# Patient Record
Sex: Female | Born: 1965 | Race: White | Hispanic: No | Marital: Married | Smoking: Former smoker
Health system: Southern US, Community
[De-identification: ages and names within clinical notes are randomized; demographics above are authoritative.]

## PROBLEM LIST (undated history)

## (undated) DIAGNOSIS — J449 Chronic obstructive pulmonary disease, unspecified: Secondary | ICD-10-CM

## (undated) DIAGNOSIS — M1990 Unspecified osteoarthritis, unspecified site: Secondary | ICD-10-CM

## (undated) DIAGNOSIS — I509 Heart failure, unspecified: Secondary | ICD-10-CM

## (undated) DIAGNOSIS — D649 Anemia, unspecified: Secondary | ICD-10-CM

## (undated) HISTORY — PX: CHOLECYSTECTOMY: SHX55

## (undated) HISTORY — PX: HIP SURGERY: SHX245

## (undated) HISTORY — PX: ECTOPIC PREGNANCY SURGERY: SHX613

## (undated) HISTORY — PX: HERNIA REPAIR: SHX51

## (undated) HISTORY — PX: KNEE SURGERY: SHX244

## (undated) HISTORY — PX: ABDOMINAL HYSTERECTOMY: SHX81

---

## 2004-07-18 ENCOUNTER — Ambulatory Visit (HOSPITAL_COMMUNITY): Admission: RE | Admit: 2004-07-18 | Discharge: 2004-07-18 | Payer: Self-pay | Admitting: Obstetrics and Gynecology

## 2004-07-21 ENCOUNTER — Inpatient Hospital Stay (HOSPITAL_COMMUNITY): Admission: RE | Admit: 2004-07-21 | Discharge: 2004-07-23 | Payer: Self-pay | Admitting: Obstetrics and Gynecology

## 2004-08-29 ENCOUNTER — Ambulatory Visit (HOSPITAL_COMMUNITY): Admission: RE | Admit: 2004-08-29 | Discharge: 2004-08-29 | Payer: Self-pay | Admitting: Obstetrics and Gynecology

## 2004-09-17 ENCOUNTER — Emergency Department (HOSPITAL_COMMUNITY): Admission: EM | Admit: 2004-09-17 | Discharge: 2004-09-18 | Payer: Self-pay | Admitting: Emergency Medicine

## 2004-10-24 ENCOUNTER — Ambulatory Visit: Payer: Self-pay | Admitting: Internal Medicine

## 2005-10-17 ENCOUNTER — Encounter (HOSPITAL_COMMUNITY): Admission: RE | Admit: 2005-10-17 | Discharge: 2005-11-16 | Payer: Self-pay | Admitting: General Surgery

## 2005-10-30 ENCOUNTER — Encounter (INDEPENDENT_AMBULATORY_CARE_PROVIDER_SITE_OTHER): Payer: Self-pay | Admitting: *Deleted

## 2005-10-30 ENCOUNTER — Ambulatory Visit (HOSPITAL_COMMUNITY): Admission: RE | Admit: 2005-10-30 | Discharge: 2005-10-30 | Payer: Self-pay | Admitting: General Surgery

## 2006-11-30 ENCOUNTER — Encounter: Admission: RE | Admit: 2006-11-30 | Discharge: 2006-11-30 | Payer: Self-pay | Admitting: Orthopedic Surgery

## 2007-04-26 ENCOUNTER — Encounter: Payer: Self-pay | Admitting: Internal Medicine

## 2007-06-27 ENCOUNTER — Ambulatory Visit: Payer: Self-pay | Admitting: Internal Medicine

## 2007-07-15 ENCOUNTER — Ambulatory Visit (HOSPITAL_COMMUNITY): Admission: RE | Admit: 2007-07-15 | Discharge: 2007-07-15 | Payer: Self-pay | Admitting: Internal Medicine

## 2007-07-15 ENCOUNTER — Emergency Department (HOSPITAL_COMMUNITY): Admission: EM | Admit: 2007-07-15 | Discharge: 2007-07-16 | Payer: Self-pay | Admitting: Emergency Medicine

## 2007-07-16 ENCOUNTER — Ambulatory Visit: Payer: Self-pay | Admitting: Internal Medicine

## 2007-07-16 ENCOUNTER — Encounter: Payer: Self-pay | Admitting: Internal Medicine

## 2007-07-16 HISTORY — PX: COLONOSCOPY: SHX174

## 2007-07-16 HISTORY — PX: ESOPHAGOGASTRODUODENOSCOPY: SHX1529

## 2007-07-17 ENCOUNTER — Inpatient Hospital Stay (HOSPITAL_COMMUNITY): Admission: RE | Admit: 2007-07-17 | Discharge: 2007-07-18 | Payer: Self-pay | Admitting: Internal Medicine

## 2007-08-02 ENCOUNTER — Ambulatory Visit: Payer: Self-pay | Admitting: Internal Medicine

## 2007-08-08 ENCOUNTER — Ambulatory Visit (HOSPITAL_COMMUNITY): Admission: RE | Admit: 2007-08-08 | Discharge: 2007-08-08 | Payer: Self-pay | Admitting: Internal Medicine

## 2007-08-16 ENCOUNTER — Ambulatory Visit: Payer: Self-pay | Admitting: Internal Medicine

## 2007-08-16 DIAGNOSIS — J4489 Other specified chronic obstructive pulmonary disease: Secondary | ICD-10-CM | POA: Insufficient documentation

## 2007-08-16 DIAGNOSIS — J449 Chronic obstructive pulmonary disease, unspecified: Secondary | ICD-10-CM

## 2007-09-13 ENCOUNTER — Telehealth (INDEPENDENT_AMBULATORY_CARE_PROVIDER_SITE_OTHER): Payer: Self-pay | Admitting: *Deleted

## 2007-10-01 ENCOUNTER — Ambulatory Visit: Payer: Self-pay | Admitting: Internal Medicine

## 2007-11-12 ENCOUNTER — Ambulatory Visit (HOSPITAL_COMMUNITY): Admission: RE | Admit: 2007-11-12 | Discharge: 2007-11-12 | Payer: Self-pay | Admitting: *Deleted

## 2007-11-29 ENCOUNTER — Telehealth (INDEPENDENT_AMBULATORY_CARE_PROVIDER_SITE_OTHER): Payer: Self-pay | Admitting: *Deleted

## 2008-11-13 ENCOUNTER — Encounter: Admission: RE | Admit: 2008-11-13 | Discharge: 2008-11-13 | Payer: Self-pay | Admitting: Neurosurgery

## 2008-12-14 ENCOUNTER — Emergency Department (HOSPITAL_COMMUNITY): Admission: EM | Admit: 2008-12-14 | Discharge: 2008-12-14 | Payer: Self-pay | Admitting: Emergency Medicine

## 2009-01-06 DIAGNOSIS — R197 Diarrhea, unspecified: Secondary | ICD-10-CM | POA: Insufficient documentation

## 2009-01-06 DIAGNOSIS — R109 Unspecified abdominal pain: Secondary | ICD-10-CM | POA: Insufficient documentation

## 2009-01-06 DIAGNOSIS — R630 Anorexia: Secondary | ICD-10-CM | POA: Insufficient documentation

## 2009-01-06 DIAGNOSIS — R110 Nausea: Secondary | ICD-10-CM | POA: Insufficient documentation

## 2009-01-06 DIAGNOSIS — Z862 Personal history of diseases of the blood and blood-forming organs and certain disorders involving the immune mechanism: Secondary | ICD-10-CM

## 2009-01-06 DIAGNOSIS — F341 Dysthymic disorder: Secondary | ICD-10-CM

## 2009-01-06 DIAGNOSIS — K219 Gastro-esophageal reflux disease without esophagitis: Secondary | ICD-10-CM | POA: Insufficient documentation

## 2009-01-06 DIAGNOSIS — Z9189 Other specified personal risk factors, not elsewhere classified: Secondary | ICD-10-CM | POA: Insufficient documentation

## 2010-04-17 ENCOUNTER — Encounter: Payer: Self-pay | Admitting: Internal Medicine

## 2010-07-01 LAB — BASIC METABOLIC PANEL
Calcium: 10.1 mg/dL (ref 8.4–10.5)
Chloride: 99 mEq/L (ref 96–112)
Potassium: 3.8 mEq/L (ref 3.5–5.1)
Sodium: 138 mEq/L (ref 135–145)

## 2010-08-09 NOTE — H&P (Signed)
[name], [name] NO.:  Burns   MEDICAL RECORD NO.:  [id number]          PATIENT TYPE:  OBV   LOCATION:  A303                          FACILITY:  [facility]   PHYSICIAN:  Dalia Heading, M.D.  DATE OF BIRTH:  20-Sep-1965   DATE OF ADMISSION:  07/16/2007  DATE OF DISCHARGE:  LH                              HISTORY & PHYSICAL   CHIEF COMPLAINT:  Right spigelian hernia.   HISTORY OF PRESENT ILLNESS:  The patient is a 45 year old white female  who was undergoing a colonoscopy today by Dr. Jena Burns for evaluation of  abdominal pain when she developed significant abdominal pain after the  procedure.  A CT scan of the abdomen and pelvis was performed, which  revealed a right spigelian hernia with omental contents present.  A  surgery consultation was obtained and the patient was brought into the  hospital due to her significant abdominal pain and nausea.   PAST MEDICAL HISTORY:  Depression.   PAST SURGICAL HISTORY:  Laparoscopic cholecystectomy in 2007, left hip  and leg surgery, and ectopic pregnancy surgery.   CURRENT MEDICATIONS:  1. Endocet.  2. HyoMax.  3. Diazepam.  4. Alprazolam.  5. Advair.  6. Asmanex inhaler.   ALLERGIES:  CODEINE.   REVIEW OF SYSTEMS:  The patient smokes a pack of cigarettes a day.  She  denies any alcohol use.  She denies any other cardiopulmonary  difficulties or bleeding disorders.   PHYSICAL EXAMINATION:  The patient is a well-developed and well-  nourished white female who is in moderate discomfort due to abdominal  pain.  VITAL SIGNS:  Stable and she is afebrile.  LUNGS:  Clear to auscultation with equal breath sounds bilaterally.  HEART:  Examination reveals a regular rate and rhythm without S3, S4, or  murmurs.  ABDOMEN:  Soft with an obvious spigelian hernia present in this region.  No hepatosplenomegaly or masses are noted.   CT scan of the abdomen and pelvis confirmed a right spigelian hernia  containing adipose  tissue.   IMPRESSION:  Right spigelian hernia.   PLAN:  The patient is scheduled for repair of the right spigelian hernia  on July 17, 2007.  The risks and benefits of the procedure including  bleeding, infection, the possibility of ongoing pain issues not related  to the surgery, and the possibly recurrence of the hernia were fully  explained to the patient, gave informed consent.      Dalia Heading, M.D.  Electronically Signed     MAJ/MEDQ  D:  07/16/2007  T:  07/17/2007  Job:  [id number]   cc:   Samuel Jester  Fax: [phone]   [facility]  Fax: [phone]   R. Roetta Sessions, M.D.  [address]  [place]  [place] [address]

## 2010-08-09 NOTE — Op Note (Signed)
[name], RASPBERRY NO.:  [id number]   MEDICAL RECORD NO.:  [id number]          PATIENT TYPE:  EMS   LOCATION:  ED                            FACILITY:  [facility]   PHYSICIAN:  Dalia Heading, M.D.  DATE OF BIRTH:  07/23/1965   DATE OF PROCEDURE:  07/17/2007  DATE OF DISCHARGE:  07/16/2007                               OPERATIVE REPORT   PREOPERATIVE DIAGNOSIS:  Right spigelian hernia.   POSTOPERATIVE DIAGNOSIS:  Right spigelian hernia.   PROCEDURE:  Right spigelian herniorrhaphy with mesh.   SURGEON:  Dalia Heading, MD.   ANESTHESIA:  General endotracheal.   INDICATIONS:  The patient is a 45 year old white female, who presents  with a symptomatic right spigelian healing hernia.  The risks and  benefits of the procedure including bleeding, infection, and recurrence  of the hernia were fully explained to the patient, gave informed  consent.   PROCEDURE NOTE:  The patient was placed in the supine position.  After  induction of general endotracheal anesthesia, the abdomen was prepped  and draped in the usual sterile technique with Betadine.  Surgical site  confirmation was performed.   An oblique incision was made in the right lower quadrant.  The external  and internal oblique aponeuroses were incised.  The spigelian hernia was  found.  The peritoneal cavity was entered into briefly just to inspect  the peritoneal abdominal wall layer.  No other hernias were noted.  The  peritoneum was closed using 2-0 chromic gut running suture.  Two  polypropylene mesh plugs, a large and a medium size, were used to invert  the hernia.  An onlay polypropylene mesh patch was then placed over this  region.  All was secured using 2-0 Prolene interrupted sutures just to  prevent dislodgement.  The internal and external oblique aponeuroses  were reapproximated using 2-0 Vicryl running sutures.  The external  oblique aponeurosis was again reinforced using 2-0 Vicryl  interrupted  sutures.  The subcutaneous layer was reapproximated using 2-0 Vicryl  interrupted sutures.  The skin was closed using staples.  Then 0.5%  Sensorcaine was instilled into the surrounding wound.  Betadine ointment  and dry sterile dressing were applied.   All tape and needle counts were correct at the end of the procedure.  The patient was extubated in the operating room, went back to recovery  room, awake in stable condition.   COMPLICATIONS:  None.   SPECIMEN:  None.   BLOOD LOSS:  Minimal.      Dalia Heading, M.D.  Electronically Signed     MAJ/MEDQ  D:  07/17/2007  T:  07/17/2007  Job:  [id number]   cc:   R. Roetta Sessions, M.D.  [address]  [place]   [address]   Catrinia Racicot  Fax: [phone]

## 2010-08-09 NOTE — Discharge Summary (Signed)
NAME:  [name], [name] NO.:  [id number]   MEDICAL RECORD NO.:  [id number]          PATIENT TYPE:  INP   LOCATION:  A303                          FACILITY:  [facility]   PHYSICIAN:  Dalia Heading, M.D.  DATE OF BIRTH:  [birth year]   DATE OF ADMISSION:  07/16/2007  DATE OF DISCHARGE:  04/23/2009LH                               DISCHARGE SUMMARY   HOSPITAL COURSE SUMMARY:  The patient is a 45 year old white female who  underwent a colonoscopy by Dr. Roetta Sessions, and had significant  abdominal pain after the procedure.  A CT scan of the abdomen and pelvis  was performed, which revealed a right spigelian hernia.  A surgery  consultation was obtained, and the patient was brought into the hospital  for management of her pain.  She subsequently underwent a right  spigelian herniorrhaphy with mesh on July 17, 2007.  She tolerated the  procedure well.  Postoperative course was remarkable for moderate pain.  Her diet was advanced without difficulty.   The patient is being discharged home on postoperative day 1 in good  improving condition.   DISCHARGE INSTRUCTIONS:  The patient is to follow up with Dr. Franky Macho on July 25, 2007.   DISCHARGE MEDICATIONS:  1. Lortab 10/650 one tablet p.o. q.4 h. p.r.n. pain.  2. HyoMax-SL 0.125 mg p.o. b.i.d.  3. Diazepam 10 mg p.o. q.6 h. p.r.n.  4. Alprazolam 0.5 mg p.o. t.i.d.  5. Advair 1 puff b.i.d.  6. Asmanex inhaler 1 puff daily.   PRINCIPAL DIAGNOSES:  1. Right spigelian hernia.  2. Anxiety/depression.  3. Chronic obstructive pulmonary disease.   PRINCIPAL PROCEDURE:  Right spigelian herniorrhaphy on July 17, 2007.      Dalia Heading, M.D.  Electronically Signed     MAJ/MEDQ  D:  07/18/2007  T:  07/18/2007  Job:  [id number]   cc:   Samuel Jester  Fax: [phone]   R. Roetta Sessions, M.D.  [address]  [place]   [address]

## 2010-08-09 NOTE — Op Note (Signed)
[name], [name]            ACCOUNT NO.:  [id number]   MEDICAL RECORD NO.:  [id number]          PATIENT TYPE:  OBV   LOCATION:  A303                          FACILITY:  [facility]   PHYSICIAN:  R. Roetta Sessions, M.D. DATE OF BIRTH:  [birth year]   DATE OF PROCEDURE:  07/16/2007  DATE OF DISCHARGE:                               OPERATIVE REPORT   INDICATIONS FOR PROCEDURE:  A 45 year old lady with lower abdominal  pain, worsening recently, diarrhea, and history of longstanding  gastroesophageal reflux disease with no prior upper GI tract imaging.  EGD and colonoscopy are now being done.  Prior CT demonstrated abnormal-  appearing liver for which an MRI of the liver was done yesterday.   She has been taking large amounts of Percocet recently for her symptoms.  EGD and colonoscopy are now being done.  This approach has been  discussed with the patient at length.  Potential risks, benefits, and  alternatives have been reviewed.  Questions answered.  Please see  documentation in medical record.   PROCEDURE NOTE:  O2 saturation, blood pressure, pulse, and respirations  monitored throughout the entire procedure.   CONSCIOUS SEDATION:  Versed 9 mg IV, Demerol 200 mg IV in divided doses,  Phenergan 25 mg IV diluted slow IV push, Cetacaine spray, topical  pharyngeal anesthesia.   INSTRUMENT:  Pentax video chip system.   FINDINGS:  EGD:  Examination of tubular esophagus revealed normal-  appearing esophageal mucosa.  EG junction was easily traversed.   Stomach:  Gastric cavity was emptied and insufflated well with air.  Thorough examination of the gastric mucosa including retroflexed view of  the proximal stomach, esophagogastric junction demonstrated only a small  hiatal hernia.  The patient heaved during the procedure and there was  some submucosal hemorrhage; otherwise, gastric mucosa appeared normal.  Pylorus was patent, easily traversed.  Examination of the bulb and  second portion  revealed no abnormality.   THERAPEUTIC/DIAGNOSTIC MANEUVERS PERFORMED:  Biopsies of D2 and D3 were  taken for histologic study.  The patient tolerated the procedure well  and was prepared for colonoscopy.  Digital rectal examination revealed  no abnormalities.   ENDOSCOPIC FINDINGS:  The prep was only fair on the right side.   Colon:  Colonic mucosa was surveyed from the rectosigmoid junction  through the left transverse right colon, and through the appendiceal  orifice, ileocecal valve, and cecum.  These structures were well seen  and photographed for the record.  Terminal ileum was intubated to good  25 cm.  From this level, the scope was slowly withdrawn.  All previously  mentioned mucosal surfaces were again seen.  There was thick, tenacious  coating of stool in the right colon in which exam was more difficult  despite of washing.  The colonic mucosa appeared normal and no obvious  evidence of neoplasm or inflammatory bowel disease.  Stool samples were  taken for microbiology lab.  Remainder of the colonic mucosa again  appeared normal as did terminal ileal mucosa.  The scope was pulled down  the rectum where the rectal mucosa was examined and the rectal wall  was  unable to retroflex, but for same reason, I was able __________ rectal  mucosa __________ and it appeared normal.  The patient had a very high  tolerance for conscious sedation medications, and the colonoscopy was  relatively poorly tolerated although technically it went very well.   IMPRESSION:  Esophagogastroduodenoscopy:  Normal esophagus, small hiatal  hernia, and submucosal petechial hemorrhages secondary to heaving.  Otherwise, normal gastric mucosa, patent pylorus, normal __________ .   COLONOSCOPY FINDINGS:  Normal rectum, colon, and terminal ileum.  Poor  prep on the right side made exam more difficult, status post stool  sampling.   RECOMMENDATIONS:  Her GI symptoms appeared to be somewhat out of  proportion  to objective findings thus far.  She has a MRI of her liver,  review pending.  Depending on her findings of the MRI, she may be best  served by getting a CONTRAST CT of the abdomen and pelvis as the next  step.  I do suspect a significant functional component, and I also  suspect substance dependence in this setting.  Further recommendations  to follow.      Jonathon Bellows, M.D.  Electronically Signed     RMR/MEDQ  D:  07/16/2007  T:  07/17/2007  Job:  [id number]   cc:   Samuel Jester  Fax: [phone]
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
## 2010-08-12 NOTE — Consult Note (Signed)
NAMEKAYELYNN, ABDOU NO.:  [id number]   MEDICAL RECORD NO.:  [id number]          PATIENT TYPE:  EMS   LOCATION:  ED                            FACILITY:  [facility]   PHYSICIAN:  Tilda Burrow, M.D. DATE OF BIRTH:  1965-11-03   DATE OF CONSULTATION:  DATE OF DISCHARGE:  09/18/2004                                   CONSULTATION   CHIEF COMPLAINT:  Abdominal pain, fever.  A 45 year old female, well known  to [place] for chronic abdominal pain, recently status post hysterectomy which  went quite well with the interesting of elongate omentum stuck down in the  cul-de-sac with apparent tension on the transverse colon.  Admitted after  presumed emergency room complaining of having fever, abdominal pain, being  sick, sick as a dog.  The patient is well known to our office.  Last  Wednesday we did an injection of the lateral aspects of the Pfannenstiel  incision and the patient had dramatic relief of her chronic right lower  quadrant pain.  We believe the right lower quadrant pain to be  musculoskeletal, related to the incision healing.  Tonight, she has fever,  generalized ache, anorexia, has been throwing up and the vomiting causes her  to hurt in the right lower quadrant, almost as expected.   LABORATORY DATA:  White count of 14,000 with 95 neutrophils, 3 bands.  CT  scan has been performed which shows no focal abnormalities.  Specifically  negative for any insuperforation or infective abscess.  Exam by Dr. Margretta Ditty  is documented elsewhere with abdomen having active bowel sounds without  rebound or guarding.  Periodic reassessments of the patient are interesting  and the patient during the time she is here in the ER has developed diarrhea  and has had three episodes of loose, watery stools in the past two hours.  It is my impression that she has a gastroenteritis that will be self-limited  and has poor tolerance of it, consistent with her overall poor tolerance of  medical  discomforts.   PLAN:  Discharge the patient home to continue liquids, give fluid bolus  prior to discharge.  Have her use Imodium as necessary.   FOLLOWUP:  Follow up in our office Monday.  She is to contact our office if  symptoms persist.       JVF/MEDQ  D:  09/18/2004  T:  09/18/2004  Job:  [id number]

## 2010-08-12 NOTE — H&P (Signed)
NAME:  [name], [name] NO.:  [id number]   MEDICAL RECORD NO.:  [id number]          PATIENT TYPE:  AMB   LOCATION:  DAY                           FACILITY:  [facility]   PHYSICIAN:  Tilda Burrow, M.D. DATE OF BIRTH:  [birth year]   DATE OF ADMISSION:  07/18/2004  DATE OF DISCHARGE:  LH                                HISTORY & PHYSICAL   ADMISSION DIAGNOSES:  Chronic pelvic pain, scheduled for abdominal  hysterectomy and possible removal of both tubes and ovaries.   HISTORY OF PRESENT ILLNESS:  This 45 year old female has been evaluated in  our office for chronic pelvic pain. The patient has had a four to five month  history of progressive lower abdominal pain after a long-standing history of  lesser degree of pelvic discomfort. This history goes as far back as 1990  when she was operated on by Dr. Ralph Dowdy and told at the time of surgery she  would never get pregnant. At that time, she had removal of a kinked tube.  She then subsequently went on to have a normal pregnancy followed by an  ectopic pregnancy in 2003 treated with laparotomy and salpingectomy. Records  have been reviewed, and she had a right salpingectomy at [facility]  after presenting with ectopic pregnancy diagnosed in the emergency room. In  evaluation in our office, she has had a dull, severe, lower abdominal pain  in the stomach. Ultrasound has been performed which shows normal external  genitalia and nonpurulent cervix, 3+ tender to contact on bimanual exam or  ultrasound. The uterus measures 8.1 cm in length x 5.0 x approximately 6 cm  transverse diameters. There is no gross abnormalities of the uterus, but it  is exquisitely tender to contact with even the lightest of uterine probe  contact. On initial visit, she probably quoted pain as a 10/10. This pain  has been present pretty continuously since February. GC and chlamydia  cultures are negative. She has been treated with empiric antibiotic  regimen  without success. Given the history of chronic pain with the acute  exacerbation, the negative ultrasound, the absence of acute evidence of  infection, and the chronic incapacitating nature, we have decided to proceed  to a hysterectomy with probable removal of tubes and ovaries as well unless  obviously normal ovaries are encountered. Given the history, this is  considered likely to require removal of tubes and ovaries.   REVIEW OF SYSTEMS:  Positive for poor circulation of lower extremities of a  long-standing nature. The patient has nonspecific traumatic complaints that  do not match the original pain complaints such as toes tingling while  standing and normal activity. She denies any peripheral neuropathy to her  legs or back pain.   HABITS:  Cigarettes, one pack per day. Alcohol denied. THC denied.   ALLERGIES:  CODEINE causes rash.   PHYSICAL EXAMINATION:  GENERAL:  Shows a sober female in apparent  significant discomfort.  VITAL SIGNS:  Blood pressure 130/80, pulse 70. Urinalysis negative.  HEENT:  Pupils are equal, round, and reactive.  NECK:  Supple.  CHEST:  Clear to  auscultation.  CARDIOVASCULAR:  Unremarkable.  ABDOMEN:  Moderate bloating with normal bowel sounds present. Well healed  lower abdominal mini laparotomy incision.  EXTERNAL GENITALIA:  Normal.  CERVIX:  Nonpurulent.  UTERUS:  Distinctly tender to contact at last check.  ADNEXA:  Shows small ovaries on each lateral side wall, less tender than the  uterine contact. There is no cul-de-sac fluid. Recent abdominal ultrasound  was ordered at [facility] on July 19, 2004.   ASSESSMENT:  Chronic pelvic pain, history of pelvic disease and infection.   PLAN:  Hysterectomy, probable removal of tubes and ovaries July 21, 2004.   The patient is made aware of the risks of infection and that absolute  guarantee of improved condition cannot be offered. The patient acknowledges  those limitations but says At  least with surgery, I have hope.      JVF/MEDQ  D:  07/19/2004  T:  07/19/2004  Job:  [id number]

## 2010-08-12 NOTE — H&P (Signed)
NAME:  [name], [name] NO.:  [id number]   MEDICAL RECORD NO.:  [id number]          PATIENT TYPE:  AMB   LOCATION:                                FACILITY:  [facility]   PHYSICIAN:  Dalia Heading, M.D.  DATE OF BIRTH:  23-Feb-1966   DATE OF ADMISSION:  DATE OF DISCHARGE:  LH                                HISTORY & PHYSICAL   CHIEF COMPLAINT:  Chronic cholecystitis.   HISTORY OF PRESENT ILLNESS:  The patient is a 45 year old white female who  is referred for evaluation and treatment of abdominal pain.  She complains  of both suprapubic and upper abdominal pain.  She does have some point  tenderness noted in the suprapubic region, and she is status post a  hysterectomy last year by Dr. Emelda Fear.  She has been having right upper  quadrant abdominal pain, nausea, and bloating for many weeks.  She does have  fatty food intolerance.  No fever, chills, or jaundice have been noted.   PAST MEDICAL HISTORY:  Includes depression.   PAST SURGICAL HISTORY:  As noted above, left leg and hip surgery, ectopic  pregnancy surgery.   CURRENT MEDICATIONS:  Cymbalta 1 tablet p.o. every day.   ALLERGIES:  CODEINE.   REVIEW OF SYSTEMS:  The patient smokes a pack of cigarettes a day.  She  denies any alcohol use.   PHYSICAL EXAMINATION:  GENERAL:  The patient is a well-developed and well-  nourished white female in no acute distress.  HEENT:  Reveals no scleral icterus.  LUNGS:  Clear to auscultation with equal breath sounds bilaterally.  HEART:  Reveals a regular rate and rhythm without S3, S4, or murmurs.  ABDOMEN:  Soft and nondistended.  She is tender both in the suprapubic  region and in the right upper quadrant.  Thickened subcutaneous tissue is  noted in the The suprapubic region.  No spigelian hernias appreciated.   CT scan of the abdomen revealed a possible small right spigelian hernia  containing only fat.  Hepatobiliary scan was performed which revealed  chronic  cholecystitis with an ejection fraction of 27%.   IMPRESSION:  1.  Biliary colic secondary to chronic cholecystitis.  2.  Asymptomatic right spigelian hernia.  3.  Incisional pain from a previous surgery.   PLAN:  At this point, she is to undergo a laparoscopic cholecystectomy.  Risks and benefits of the procedure including bleeding, infection,  hepatobiliary injury, the possibility of an open procedure were fully  explained to the patient, who gave informed consent.  At the time of  surgery, the abdominal wall will be visualized both in the right lower  quadrant region as well as suprapubic region.      Dalia Heading, M.D.  Electronically Signed     MAJ/MEDQ  D:  10/19/2005  T:  10/19/2005  Job:  [id number]   cc:   Dalia Heading, M.D.  Fax: [phone]   [facility]  Fax: [phone]   [facility]on Nickolas  Fax: [phone]

## 2010-08-12 NOTE — Discharge Summary (Signed)
[name], [name] NO.:  [id number]   MEDICAL RECORD NO.:  [id number]          PATIENT TYPE:  INP   LOCATION:  A416                          FACILITY:  [facility]   PHYSICIAN:  Tilda Burrow, M.D. DATE OF BIRTH:  [birth year]   DATE OF ADMISSION:  07/21/2004  DATE OF DISCHARGE:  04/29/2006LH                                 DISCHARGE SUMMARY   ADMITTING DIAGNOSIS:  Chronic pelvic pain.   DISCHARGE DIAGNOSES:  1.  Chronic pelvic pain.  2.  Pelvic adhesions.  3.  Pelvic relaxation.   PROCEDURE:  Total abdominal hysterectomy, lysis of adhesions, appendectomy.   SURGEON:  Emelda Fear   ASSISTANT:  _tulloch. cst,fa_______   ANESTHESIA:  General.   SPECIMENS:  Uterus, omental adhesions, appendix.   ESTIMATED BLOOD LOSS:  Less than 200 mL.   FINDINGS:  Omentum stuck in the deep cul-de-sac, appendix stuck down in the  cul-de-sac as well.   DISCHARGE MEDICATIONS:  1.  Lexapro 10 mg p.o. once daily.  2.  Lortab 10/500 one to two q.6h. p.r.n. pain dispense 40 refill x1.   FOLLOW UP:  Postop day #8 our office for staple removal.   HOSPITAL SUMMARY:  [age]-year-old female was admitted for severe pain  of the uterus which had been going on for several years.  The patient had  trouble even standing up straight.  She had a history of right salpingectomy  after ectopic pregnancy, some of the dull lower abdominal pain interfered  with normal activities and the uterus was exquisitely tender on contact  though GC and Chlamydia cultures were negative.   HOSPITAL COURSE:  Patient was taken to the operating room through day  surgery after a bowel prep.  The  uterus was removed and extensive omental  adhesions to the cul-de-sac dissected free.  The appendix was caught up in  the cul-de-sac adhesions and was removed as well.  It is felt this is  completely consistent with the patient's history of a traction type of pain  that was worse when she tried to stand up  straight.   The patient had an admitting hemoglobin of 12.7, hematocrit 36.8,  postoperatively it was 11.3, hematocrit 32.1.  She had resumption of bowel  function within 2 days and was discharged home postop day #2 on Lortab 10  and Lexapro 10, has postop care followup 8 days our office.      JVF/MEDQ  D:  07/23/2004  T:  07/23/2004  Job:  [id number]   cc:   [address]  [place]  [place] [address]  Fax: [phone]

## 2010-08-12 NOTE — Op Note (Signed)
NAMEGLEN, BLATCHLEY NO.:  [id number]   MEDICAL RECORD NO.:  [id number]          PATIENT TYPE:  AMB   LOCATION:  DAY                           FACILITY:  [facility]   PHYSICIAN:  Dalia Heading, M.D.  DATE OF BIRTH:  February 21, 1966   DATE OF PROCEDURE:  10/30/2005  DATE OF DISCHARGE:                                 OPERATIVE REPORT   PREOPERATIVE DIAGNOSIS:  Chronic cholecystitis.   POSTOPERATIVE DIAGNOSIS:  Chronic cholecystitis.   PROCEDURE:  Laparoscopic cholecystectomy.   SURGEON:  Dalia Heading, M.D.   ASSISTANT:  Bernerd Limbo. Leona Carry, M.D.   ANESTHESIA:  General endotracheal.   INDICATIONS:  The patient is a 45 year old white female who presents with  chronic cholecystitis and biliary colic.  The risks and benefits of the  procedure including bleeding, infection, hepatobiliary injury, and the  possibly an open procedure, were fully explained to the patient, who gave  informed consent.   PROCEDURE NOTE:  The patient was placed in the supine position.  After  induction of general endotracheal anesthesia, the abdomen was prepped and  draped using the usual sterile technique with Betadine.  Surgical site  confirmation was performed.   A supraumbilical incision was made down to the fascia.  A Veress needle was  introduced into the abdominal cavity and confirmation of placement was done  using the saline drop.  The abdomen was then insufflated to 16 mmHg  pressure.  An 11-mm trocar was introduced into the abdominal cavity under  direct visualization without difficulty.  The patient was placed in reversed  Trendelenburg position.  An additional 11-mm trocar was placed in the  epigastric region and 5-mm trocars placed in the right upper quadrant and  right flank regions.  The liver was inspected and noted to be within normal  limits.  The abdominal wall was inspected and no specific hernias could be  identified.  The gallbladder was retracted superiorly and  laterally.  The  dissection was begun around the infundibulum of the gallbladder.  The cystic  duct was first identified.  Its juncture to the infundibulum fully  identified.  Endoclips were placed proximally and distally on the cystic  duct and cystic duct was divided.  This was likewise done to the cystic  artery.  The gallbladder then freed away from the gallbladder fossa using  Bovie electrocautery.  The gallbladder was delivered through the epigastric  trocar site using an EndoCatch bag.  The gallbladder fossa was inspected and  no abnormal bleeding or bile leakage was noted.  Surgicel was placed in the  gallbladder fossa.  All fluid and air were then evacuated from the abdominal  cavity prior to removal of the trocars.   All wounds were irrigated with normal saline.  All wounds were injected with  0.5% Sensorcaine.  The supraumbilical fascia was reapproximated using an 0  Vicryl interrupted suture.  All skin incisions were closed using staples.  Betadine ointment and a dry sterile dressing were applied.   All tape and needle counts were correct at the end of the procedure.  The  patient was extubated in  the operating room went back to the recovery room  awake in stable condition.   COMPLICATIONS:  None.   SPECIMEN:  Gallbladder.   BLOOD LOSS:  Minimal.      Dalia Heading, M.D.  Electronically Signed     MAJ/MEDQ  D:  10/30/2005  T:  10/30/2005  Job:  [id number]   cc:   Samuel Jester  Fax: [phone]

## 2010-08-12 NOTE — Op Note (Signed)
[name], [name] NO.:  [id number]   MEDICAL RECORD NO.:  [id number]          PATIENT TYPE:  INP   LOCATION:  A416                          FACILITY:  [facility]   PHYSICIAN:  Tilda Burrow, M.D. DATE OF BIRTH:  [birth year]   DATE OF PROCEDURE:  07/21/2004  DATE OF DISCHARGE:  07/23/2004                                 OPERATIVE REPORT   PREOPERATIVE DIAGNOSIS:  Chronic pelvic pain.   POSTOPERATIVE DIAGNOSES:  1.  Chronic pelvic pain.  2.  Pelvic adhesions.  3.  Pelvic relaxation.   OPERATION/PROCEDURE:  1.  Total abdominal hysterectomy.  2.  Lysis of adhesions.  3.  Appendectomy.   SURGEON:  Tilda Burrow, M.D.   ASSISTANT:  _Tulloch__ C.S.T.-F.A.   ANESTHESIA:  General.   SPECIMENS:  Uterus, omental adhesions, appendix.   ESTIMATED BLOOD LOSS:  Less than 200 mL.   FINDINGS:  Omentum stuck deep in a very deep cul-de-sac.  Normal-appearing  ovaries.   INDICATIONS:  A 45 year old female with chronic pelvic pain, particularly  noted when the patient tries to stand up from a sitting for flexed forward  position, going on at least a couple of years.  See HPI.   DESCRIPTION OF PROCEDURE:  The patient was taken to the operating room,  Foley catheter in place, vaginal and abdominal prep performed.  A  Pfannenstiel-type incision was performed with excision of the old laparotomy  incision to improve symmetry of the surgical scar.  Pfannenstiel-type  peritoneal entry was performed without difficulty with no evidence of  adhesions to the anterior abdominal wall.  The omentum was interestingly  stuck with significant tightness to the cul-de-sac.  There were a couple of  thin adhesions to the back side of the uterus.  We carefully elevated the  uterus with Lahey thyroid tenaculum and proceeded to sharply dissect the  omental adhesions off the cul-de-sac.  The omentum actually had multiple  windows in it and the transverse colon was well below the  umbilicus.  It is  my belief that she had had longstanding traction on the transverse colon and  omentum from these extensive adhesions.  Tubes and ovaries were inspected  and were completely normal in appearance.  Decision was made to try to  salvage them.  Once the omentum could be dissected free, the windows were  dealt with transecting of the inferior-most portion of the omentum with 3-0  Dexon ties used to cross the omental pedicles.  We then proceeded with  hysterectomy.  Round ligaments were doubly ligated bilaterally, transected  and bladder flap developed.  The utero-ovarian ligaments were isolated,  clamped, cut and suture ligated on either side with 0 chromic, and then the  uterine vessels skeletonized on either side.  We marched down the lateral  side of the uterus, clamping across the uterine broad ligament and uterine  vessels with curved Heaney clamps.  Kelly clamp used for backbleeding,  transecting and suture ligating with 0 chromic.  The upper and lower  cardinal ligaments were clamped, cut and suture ligated serially using  straight Heaney clamps, knife dissection and  0 chromic suture ligature.  The cervix was quite large and bulbous.  The uterosacral ligament came into  the back of the uterus and there was a very deep cul-de-sac.  We then  proceeded to make a stab incision in the anterior cervical fornix, amputate  the cervix off the vaginal cuff and hold the vaginal cuff with Kochers x4.  The lateral vaginal angle was attached to the lower cardinal ligaments with  an Aldridge stitch on either side.  We felt that the vaginal apex was quite  large and potential for vaginal vault prolapse would be great and,  therefore, modified cuff closure was performed.  We closed the cuff in the  sagittal plane.  The first thing we did was excise a wedge of tissue from  the anterior portion of the vaginal cuff and then closed the cuff with a  series of interrupted sutures beginning in  the posterior cul-de-sac.  This  pulled the uterosacral ligament into the cuff support system.  This improved  mid pelvic support without achieving tautness and likely reduces potential  for enterocele or vaginal vault prolapse.   The peritoneum was not pulled over the cuff, the bladder flap was left only  tagged over the middle portion of the cuff.  Hemostasis was satisfactory.  Irrigation of the pelvis was performed without difficulty.   It should be noted that the appendix was unusually long and stuck down in  the omental adhesions as well at its tip.  This raised the question of  whether this had all been chronic appendicitis that had started the problem  or PID.  Is unable to determine the etiology of her original problems.   We then proceeded with appendectomy.  The appendix was long and retrocecal.  The appendiceal mesentery was divided into multiple pedicles with the  hemostats placed across the pedicles and the appendix isolated.  The  pedicles were tied with 3-0 Dexon ties.  The appendiceal stump itself was  doubly clamped with hemostats transected between them.  A ligation with 2-0  chromic performed on the appendiceal stump.  A pursestring suture was then  performed to imbricate the appendiceal stump to reduce potential for  pressure on the appendiceal stump later.  The patient tolerated the  procedure well.  Irrigation was again performed. There was no evidence of  injury to bowel noted.  The omentum was repositioned under the anterior  incision, not allowed to reach into the cul-de-sac where it had previously  been.  We closed the peritoneum with 2-0 chromic, closed the fascia with  continuous running 0 Dexon, closed the subcutaneous potential space with 2-0  plain and staple closure of the skin the complete the procedure.  The  patient went to the recovery room in good condition.  Sponge and needle  counts correct.  Estimated blood loss 200 mL.    JVF/MEDQ  D:   07/23/2004  T:  07/24/2004  Job:  [id number]   cc:   Leo [id number]  [place]  [place] [address]  Fax: [phone]

## 2010-08-12 NOTE — Op Note (Signed)
[name], [name] NO.:  [id number]   MEDICAL RECORD NO.:  [id number]          PATIENT TYPE:  INP   LOCATION:  A416                          FACILITY:  [facility]   PHYSICIAN:  Tilda Burrow, M.D. DATE OF BIRTH:  26-Aug-1965   DATE OF PROCEDURE:  DATE OF DISCHARGE:  07/23/2004                                 OPERATIVE REPORT   PREOPERATIVE DIAGNOSIS:  Chronic pelvic pain.   POSTOPERATIVE DIAGNOSES:  1.  Chronic pelvic pain.  2.  Extensive pelvic adhesions, omentum to pelvic cul-de-sac.   OPERATION/PROCEDURE:  1.  Total abdominal hysterectomy.  2.  Lysis of pelvic adhesions.  3.  Appendectomy.   SURGEON:  Tilda Burrow, M.D.   ANESTHESIA:  General.   COMPLICATIONS:  None.   FINDINGS:  Very elongate omentum which is stretched down under tension from  the transverse colon which was quite stretched downward in its own right.  Omental adhesions were densely attached to the cul-de-sac and felt to  represent a likely source of pain.   DESCRIPTION OF PROCEDURE:  The patient was taken to the operating room,  prepped and draped for lower abdominal surgery.  Foley catheter was then  inserted and vaginal prepping was performed.  The lower abdominal transverse  incision was corrected from its rather asymmetric angulation by taking an  ellipse of skin and fatty tissue incorporating the old scar, being careful  to be symmetric and in our incisions.  The abdominal cavity was entered in  the standard method of Pfannenstiel with transverse opening of the fascia,  midline splitting of the rectus muscles and careful blunt and sharp  dissection used to identify the peritoneal cavity and open the peritoneum.  Incision was opened superiorly and inferiorly, and space in the pelvis  performed.  There was extensive omental adhesions relatively thin in  character but extensive adhesions to the back of the uterus and to the cul-  de-sac.  These required careful serial  dissection.  Two small wet laparotomy  tapes were used to lift the portions of the bowel that were free and then we  proceeded with transverse primary sharp dissection in order to free up the  pelvis.  Interestingly, the ovaries were absolutely not involved in the  omental adhesions.  We were able to free up the omentum leaving only very  small remnants of omentum tissue in the cul-de-sac.   The Lahey thyroid tenaculum was being used on the uterine fundus to elevate  the uterus.  The uterus was actually quite relaxed in its support.  Round  ligaments were then clamped, cut and suture ligated taking them down, and  then the utero-ovarian ligaments on either side isolated, clamped, cut and  suture ligated.  Uterine vessels could be skeletonized on each side and then  curved Heaney clips were placed across the uterine vessels with Kelly clamps  for backbleeding.  The vessels were transected and ligated with 0 chromic.  The upper and lower cardinal ligaments were serially clamped, cut and suture  ligated on either side, marching down the cervix to the vaginal cuff.  The  cervix  was quite large and wide and a stab incision in the anterior cervical  vaginal fornix was followed by amputating the cervix off the vaginal cuff  and hold the cuff with four Kocher clamps.  The cuff was quite large and  combined with pelvic laxity, it was felt that pelvic support required  improvement.  Decision was made to close the cuff in the midline.   First, an Aldridge stitch was placed in each lateral vaginal angle to attach  lower cardinal ligaments to the cuff and control hemostasis.  We then  proceeded to remove a small wedge from the anterior cervix to allow for  easier approximation of the vaginal apex and reduction of the large vaginal  apex diameter.  A posterior cul-de-sac was pulled together in the midline as  well which brought the uterosacral ligaments into the cuff support in what  was felt to be a  very constructive way.  The entire cuff was closed in the  midline with good tissue support resulting.  The cuff was not too tight but  was considered adequately supported.  Irrigation of the pelvis followed and  then hemostasis confirmed, then with bladder flap reapproximation to the  anterior one-half of the cuff and inspecting the ovary and pedicles for  hemostasis.  The peritoneum, then the laparotomy equipment was removed.  Laparotomy tapes were removed.  The bowel was repositioned in its normal  orientation with no further problems identified.  It should be noted that  the transverse colon naturally rested below the umbilicus due to its  elongate lax positioning and it felt that perhaps this was related to  adhesions in the cul-de-sac.  The inferior irregular margins of the omentum  where there were some windows in the omentum were trimmed free and the ends  ligated as necessary to achieve hemostasis.   At this time the procedure was considered satisfactorily completed.  The  anterior peritoneum was closed using 2-0 Prolene and fascia closed with  continuous running 0 Vicryl.  Subcutaneous tissue was reapproximated with 2-  0 plain and stapled closure of the skin completed the procedure.  The  patient tolerated the procedure well and went to the recovery room in good  condition.      JVF/MEDQ  D:  07/27/2004  T:  07/27/2004  Job:  [id number]

## 2010-09-30 ENCOUNTER — Emergency Department (HOSPITAL_COMMUNITY)
Admission: EM | Admit: 2010-09-30 | Discharge: 2010-09-30 | Discharge status: Against Medical Advice | Payer: 59 | Attending: Emergency Medicine | Admitting: Emergency Medicine

## 2010-11-26 ENCOUNTER — Emergency Department (HOSPITAL_COMMUNITY)
Admission: EM | Admit: 2010-11-26 | Discharge: 2010-11-26 | Disposition: A | Discharge status: Home / Self Care | Payer: 59 | Attending: Emergency Medicine | Admitting: Emergency Medicine

## 2010-11-26 ENCOUNTER — Encounter: Payer: Self-pay | Admitting: *Deleted

## 2010-11-26 DIAGNOSIS — J449 Chronic obstructive pulmonary disease, unspecified: Secondary | ICD-10-CM | POA: Insufficient documentation

## 2010-11-26 DIAGNOSIS — J4489 Other specified chronic obstructive pulmonary disease: Secondary | ICD-10-CM | POA: Insufficient documentation

## 2010-11-26 DIAGNOSIS — R21 Rash and other nonspecific skin eruption: Secondary | ICD-10-CM | POA: Insufficient documentation

## 2010-11-26 DIAGNOSIS — W57XXXA Bitten or stung by nonvenomous insect and other nonvenomous arthropods, initial encounter: Secondary | ICD-10-CM

## 2010-11-26 DIAGNOSIS — F17200 Nicotine dependence, unspecified, uncomplicated: Secondary | ICD-10-CM | POA: Insufficient documentation

## 2010-11-26 HISTORY — DX: Chronic obstructive pulmonary disease, unspecified: J44.9

## 2010-11-26 HISTORY — DX: Anemia, unspecified: D64.9

## 2010-11-26 MED ORDER — FAMOTIDINE 20 MG PO TABS: ORAL_TABLET | ORAL | Status: DC

## 2010-11-26 MED ORDER — FAMOTIDINE 20 MG PO TABS
20.0000 mg | ORAL_TABLET | Freq: Once | ORAL | Status: AC
  Administered 2010-11-26: 20 mg via ORAL
  Filled 2010-11-26: qty 1

## 2010-11-26 MED ORDER — DIPHENHYDRAMINE HCL 25 MG PO TABS: ORAL_TABLET | ORAL | Status: DC

## 2010-11-26 MED ORDER — PREDNISONE 10 MG PO TABS: ORAL_TABLET | ORAL | Status: DC

## 2010-11-26 MED ORDER — METHYLPREDNISOLONE SODIUM SUCC 125 MG IJ SOLR
125.0000 mg | Freq: Once | INTRAMUSCULAR | Status: AC
  Administered 2010-11-26: 125 mg via INTRAMUSCULAR
  Filled 2010-11-26: qty 2

## 2010-11-26 MED ORDER — DIPHENHYDRAMINE HCL 50 MG/ML IJ SOLN
50.0000 mg | Freq: Once | INTRAMUSCULAR | Status: AC
  Administered 2010-11-26: 50 mg via INTRAMUSCULAR
  Filled 2010-11-26: qty 1
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
## 2010-11-26 NOTE — ED Notes (Signed)
Family at bedside. 
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
## 2010-12-05 ENCOUNTER — Encounter (HOSPITAL_COMMUNITY): Payer: Self-pay | Admitting: *Deleted

## 2010-12-05 ENCOUNTER — Emergency Department (HOSPITAL_COMMUNITY)
Admission: EM | Admit: 2010-12-05 | Discharge: 2010-12-06 | Disposition: A | Discharge status: Other Acute Inpatient Hospital | Payer: 59 | Attending: Emergency Medicine | Admitting: Emergency Medicine

## 2010-12-05 DIAGNOSIS — J449 Chronic obstructive pulmonary disease, unspecified: Secondary | ICD-10-CM | POA: Insufficient documentation

## 2010-12-05 DIAGNOSIS — Z862 Personal history of diseases of the blood and blood-forming organs and certain disorders involving the immune mechanism: Secondary | ICD-10-CM | POA: Insufficient documentation

## 2010-12-05 DIAGNOSIS — F22 Delusional disorders: Secondary | ICD-10-CM

## 2010-12-05 DIAGNOSIS — J4489 Other specified chronic obstructive pulmonary disease: Secondary | ICD-10-CM | POA: Insufficient documentation

## 2010-12-05 DIAGNOSIS — R45851 Suicidal ideations: Secondary | ICD-10-CM

## 2010-12-05 HISTORY — DX: Unspecified osteoarthritis, unspecified site: M19.90

## 2010-12-05 LAB — RAPID URINE DRUG SCREEN, HOSP PERFORMED
Amphetamines: POSITIVE — AB
Benzodiazepines: NOT DETECTED
Cocaine: NOT DETECTED
Opiates: NOT DETECTED

## 2010-12-05 LAB — DIFFERENTIAL
Basophils Absolute: 0 10*3/uL (ref 0.0–0.1)
Basophils Relative: 0 % (ref 0–1)
Neutro Abs: 7.6 10*3/uL (ref 1.7–7.7)
Neutrophils Relative %: 73 % (ref 43–77)

## 2010-12-05 LAB — URINALYSIS, ROUTINE W REFLEX MICROSCOPIC
Bilirubin Urine: NEGATIVE
Glucose, UA: NEGATIVE mg/dL
Ketones, ur: NEGATIVE mg/dL
Leukocytes, UA: NEGATIVE
pH: 6 (ref 5.0–8.0)

## 2010-12-05 LAB — CBC
MCH: 32.8 pg (ref 26.0–34.0)
RDW: 14.1 % (ref 11.5–15.5)

## 2010-12-05 LAB — BASIC METABOLIC PANEL
Chloride: 106 mEq/L (ref 96–112)
GFR calc Af Amer: >60 mL/min (ref >60)
Potassium: 3.6 mEq/L (ref 3.5–5.1)
Sodium: 139 mEq/L (ref 135–145)

## 2010-12-05 MED ORDER — LORAZEPAM 1 MG PO TABS
1.0000 mg | ORAL_TABLET | Freq: Three times a day (TID) | ORAL | Status: DC | PRN
  Administered 2010-12-06: 1 mg via ORAL
  Filled 2010-12-05: qty 1

## 2010-12-05 MED ORDER — IBUPROFEN 400 MG PO TABS: 400.0000 mg | ORAL_TABLET | Freq: Three times a day (TID) | ORAL | Status: DC | PRN

## 2010-12-05 MED ORDER — ALUM & MAG HYDROXIDE-SIMETH 200-200-20 MG/5ML PO SUSP: 30.0000 mL | ORAL | Status: DC | PRN

## 2010-12-05 MED ORDER — NICOTINE 21 MG/24HR TD PT24
MEDICATED_PATCH | TRANSDERMAL | Status: AC
  Filled 2010-12-05: qty 1

## 2010-12-05 MED ORDER — ONDANSETRON HCL 4 MG PO TABS: 4.0000 mg | ORAL_TABLET | Freq: Three times a day (TID) | ORAL | Status: DC | PRN

## 2010-12-05 MED ORDER — NICOTINE 21 MG/24HR TD PT24
21.0000 mg | MEDICATED_PATCH | Freq: Every day | TRANSDERMAL | Status: DC | PRN
  Administered 2010-12-05: 20:00:00 via TRANSDERMAL

## 2010-12-05 MED ORDER — ZOLPIDEM TARTRATE 5 MG PO TABS: 5.0000 mg | ORAL_TABLET | Freq: Every evening | ORAL | Status: DC | PRN

## 2010-12-05 MED ORDER — ACETAMINOPHEN 325 MG PO TABS: 650.0000 mg | ORAL_TABLET | ORAL | Status: DC | PRN

## 2010-12-05 NOTE — ED Notes (Signed)
Pt here under IVC papers with hand written note attached to her family stating that she cannot go on without her husband.  Pt admits to writing the note last night.  Pt denies having SI/HI at this time.  States "I just had a weak moment in time."  Pt calm, cooperative at this time.

## 2010-12-05 NOTE — ED Notes (Signed)
rn at bedside for telepsych consult

## 2010-12-05 NOTE — ED Notes (Signed)
Telepsych consulted. Awaiting return call for psychiatrist

## 2010-12-05 NOTE — ED Notes (Signed)
A&Ox3. Skin w/p/d. Resp even and unlabored. Pt complaining of R hand pain. Pt calm and cooperative at this time. Denies SI/HI. Denies auditory or visual hallucinations. Pt mother at bedside. Awaiting placement.

## 2010-12-05 NOTE — ED Provider Notes (Signed)
History     CSN: [id number] Arrival date & time: 12/05/2010  2:05 PM  Chief Complaint  Patient presents with  . Medical Clearance   HPI Pt was seen at 1420.  Per pt, c/o gradual onset and persistence of constant anxiety and depression for the past several days.  States she "found out that my husband may have cheated on me and fathered a child 2 years ago" over this past weekend.  States she has been with him from the time she was 45yo, has multiple children and grandchildren; including a disabled 13yo child.  Pt states she was "upset" and "didn't think I could live without him."  States she wrote a letter saying so, threatening SI, and was brought today to the ED under IVC by family.  States she has spoken with her mother and children and feels "calmer now."  Denies HI, no SA.  Past Medical History  Diagnosis Date  . COPD (chronic obstructive pulmonary disease)   . Asthma   . Anemia   . Arthritis     Past Surgical History  Procedure Date  . Abdominal hysterectomy   . Cholecystectomy   . Hip surgery   . Knee surgery   . Ectopic pregnancy surgery     History  Substance Use Topics  . Smoking status: Current Everyday Smoker -- 1.0 packs/day  . Smokeless tobacco: Not on file  . Alcohol Use: No    Review of Systems ROS: Statement: All systems negative except as marked or noted in the HPI; Constitutional: Negative for fever and chills. ; ; Eyes: Negative for eye pain, redness and discharge. ; ; ENMT: Negative for ear pain, hoarseness, nasal congestion, sinus pressure and sore throat. ; ; Cardiovascular: Negative for chest pain, palpitations, diaphoresis, dyspnea and peripheral edema. ; ; Respiratory: Negative for cough, wheezing and stridor. ; ; Gastrointestinal: Negative for nausea, vomiting, diarrhea and abdominal pain, blood in stool, hematemesis, jaundice and rectal bleeding. . ; ; Genitourinary: Negative for dysuria, flank pain and hematuria. ; ; Musculoskeletal: Negative for back  pain and neck pain. Negative for swelling and trauma.; ; Skin: Negative for pruritus, rash, abrasions, blisters, bruising and skin lesion.; ; Neuro: Negative for headache, lightheadedness and neck stiffness. Negative for weakness, altered level of consciousness , altered mental status, extremity weakness, paresthesias, involuntary movement, seizure and syncope.  +depression, anxiety.     Physical Exam  BP 152/69  Pulse 74  Temp(Src) 98.7 F (37.1 C) (Oral)  Resp 16  Ht 5\' 4"  (1.626 m)  Wt 134 lb (60.782 kg)  BMI 23.00 kg/m2  SpO2 100%  Physical Exam 1425: Physical examination:  Nursing notes reviewed; Vital signs and O2 SAT reviewed;  Constitutional: Well developed, Well nourished, Well hydrated, In no acute distress; Head:  Normocephalic, atraumatic; Eyes: EOMI, PERRL, No scleral icterus; ENMT: Mouth and pharynx normal, Mucous membranes moist; Neck: Supple, Full range of motion, No lymphadenopathy; Cardiovascular: Regular rate and rhythm, No murmur, rub, or gallop; Respiratory: Breath sounds clear & equal bilaterally, No rales, rhonchi, wheezes, or rub, Normal respiratory effort/excursion; Chest: Nontender, Movement normal; Extremities: Pulses normal, No tenderness, No edema, No calf edema or asymmetry.; Neuro: AA&Ox3, Major CN grossly intact.  No gross focal motor or sensory deficits in extremities.; Skin: Color normal, Warm, Dry.  Affect appropriate, occas tearful.   ED Course  Procedures  MDM MDM Reviewed: nursing note and vitals Interpretation: labs   Results for orders placed during the hospital encounter of 12/05/10  CBC  Component Value Range   WBC 10.5  4.0 - 10.5 (K/uL)   RBC 4.12  3.87 - 5.11 (MIL/uL)   Hemoglobin 13.5  12.0 - 15.0 (g/dL)   HCT 16.1  09.6 - 04.5 (%)   MCV 96.6  78.0 - 100.0 (fL)   MCH 32.8  26.0 - 34.0 (pg)   MCHC 33.9  30.0 - 36.0 (g/dL)   RDW 40.9  81.1 - 91.4 (%)   Platelets 489 (*) 150 - 400 (K/uL)  DIFFERENTIAL      Component Value Range     Neutrophils Relative 73  43 - 77 (%)   Neutro Abs 7.6  1.7 - 7.7 (K/uL)   Lymphocytes Relative 22  12 - 46 (%)   Lymphs Abs 2.3  0.7 - 4.0 (K/uL)   Monocytes Relative 4  3 - 12 (%)   Monocytes Absolute 0.4  0.1 - 1.0 (K/uL)   Eosinophils Relative 1  0 - 5 (%)   Eosinophils Absolute 0.1  0.0 - 0.7 (K/uL)   Basophils Relative 0  0 - 1 (%)   Basophils Absolute 0.0  0.0 - 0.1 (K/uL)  BASIC METABOLIC PANEL      Component Value Range   Sodium 139  135 - 145 (mEq/L)   Potassium 3.6  3.5 - 5.1 (mEq/L)   Chloride 106  96 - 112 (mEq/L)   CO2 23  19 - 32 (mEq/L)   Glucose, Bld 95  70 - 99 (mg/dL)   BUN 7  6 - 23 (mg/dL)   Creatinine, Ser 7.82  0.50 - 1.10 (mg/dL)   Calcium 9.3  8.4 - 95.6 (mg/dL)   GFR calc non Af Amer >60  >60 (mL/min)   GFR calc Af Amer >60  >60 (mL/min)  URINE RAPID DRUG SCREEN (HOSP PERFORMED)      Component Value Range   Opiates NONE DETECTED  NONE DETECTED    Cocaine NONE DETECTED  NONE DETECTED    Benzodiazepines NONE DETECTED  NONE DETECTED    Amphetamines POSITIVE (*) NONE DETECTED    Tetrahydrocannabinol POSITIVE (*) NONE DETECTED    Barbiturates NONE DETECTED  NONE DETECTED   ETHANOL      Component Value Range   Alcohol, Ethyl (B) <11  0 - 11 (mg/dL)  URINALYSIS, ROUTINE W REFLEX MICROSCOPIC      Component Value Range   Color, Urine YELLOW  YELLOW    Appearance CLEAR  CLEAR    Specific Gravity, Urine >1.030 (*) 1.005 - 1.030    pH 6.0  5.0 - 8.0    Glucose, UA NEGATIVE  NEGATIVE (mg/dL)   Hgb urine dipstick NEGATIVE  NEGATIVE    Bilirubin Urine NEGATIVE  NEGATIVE    Ketones, ur NEGATIVE  NEGATIVE (mg/dL)   Protein, ur NEGATIVE  NEGATIVE (mg/dL)   Urobilinogen, UA 0.2  0.0 - 1.0 (mg/dL)   Nitrite NEGATIVE  NEGATIVE    Leukocytes, UA NEGATIVE  NEGATIVE   PREGNANCY, URINE      Component Value Range   Preg Test, Ur NEGATIVE       1720:  Pt's daughter now at bedside.  States mother has been "not acting right" for approx 6 weeks.  Has been regularly  threatening SI, grabbing pill bottles saying she will OD on them, grabbing knife and holding it to her chest or throat and threatening to kill herself.  This has escalated over the past 2-3 days.  Daughter states pt has been regularly accusing husband of fathering child  outside of the marriage, husband denies.  Pt was overheard by ED RN to state to other staff members that she "is going to act calm and nice here so I can go home and just do what I have to do."  Telepsych MD called, case discussed.  He will speak to both pt and her daughter.   1830:  Telepsych MD spoke with pt, pt's daughter and pt's mother.  Pt continues to deny SI, but daughter and granddaughter both confirm pt has been voicing SI, had fixed delusion regarding her husband fathering child outside of the marriage, and has been obtaining percocet "off the street" for her chronic back pain, as well as taking her child's Adderall.  Will continue IVC and admit pt.  ACT team Samson Frederic called, case discussed, will come to ED to start admit/bed search process.  Family aware.   11:01 PM:  Sign out to Dr. Lynelle Doctor.     Ahlani Wickes Allison Quarry, DO 12/05/10 2301

## 2010-12-05 NOTE — ED Notes (Signed)
Pt resting calmly w/ eyes closed. Rise & fall of the chest noted. NAD noted at this time.  Sitter remains outside room at this time.

## 2010-12-06 ENCOUNTER — Inpatient Hospital Stay (HOSPITAL_COMMUNITY)
Admission: AD | Admit: 2010-12-06 | Discharge: 2010-12-08 | DRG: 885 | Disposition: A | Discharge status: Home / Self Care | Payer: 59 | Attending: Psychiatry | Admitting: Psychiatry

## 2010-12-06 ENCOUNTER — Emergency Department (HOSPITAL_COMMUNITY): Payer: 59

## 2010-12-06 DIAGNOSIS — J4489 Other specified chronic obstructive pulmonary disease: Secondary | ICD-10-CM

## 2010-12-06 DIAGNOSIS — M549 Dorsalgia, unspecified: Secondary | ICD-10-CM

## 2010-12-06 DIAGNOSIS — Z79899 Other long term (current) drug therapy: Secondary | ICD-10-CM

## 2010-12-06 DIAGNOSIS — F332 Major depressive disorder, recurrent severe without psychotic features: Principal | ICD-10-CM

## 2010-12-06 DIAGNOSIS — F411 Generalized anxiety disorder: Secondary | ICD-10-CM

## 2010-12-06 DIAGNOSIS — J449 Chronic obstructive pulmonary disease, unspecified: Secondary | ICD-10-CM

## 2010-12-06 DIAGNOSIS — G8929 Other chronic pain: Secondary | ICD-10-CM

## 2010-12-06 DIAGNOSIS — R45851 Suicidal ideations: Secondary | ICD-10-CM

## 2010-12-06 NOTE — ED Notes (Signed)
Pt resting calmly w/ eyes closed. Rise & fall of the chest noted.  Bed in low position, side rails up x2. NAD noted & no needs voiced at this time. Sitter remains outside pt room. 

## 2010-12-06 NOTE — Progress Notes (Signed)
Patient with depression here for placement. Paperwork on the chart. Awaits placement. Vital signs stable. C/o hand pian. xrays normal.

## 2010-12-06 NOTE — ED Notes (Addendum)
Pt requesting something to help her rest better. Pt given ativan from holding orders. Pt also states her right hand is hurting & they were going to xray. Spoke w/ edp ok to have xray done.

## 2010-12-07 DIAGNOSIS — F339 Major depressive disorder, recurrent, unspecified: Secondary | ICD-10-CM

## 2010-12-07 DIAGNOSIS — F411 Generalized anxiety disorder: Secondary | ICD-10-CM
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
## 2010-12-13 ENCOUNTER — Ambulatory Visit (HOSPITAL_COMMUNITY): Payer: 59 | Admitting: Psychiatry

## 2010-12-20 LAB — BASIC METABOLIC PANEL
BUN: 3 — ABNORMAL LOW
Calcium: 8.8
Creatinine, Ser: 0.7
GFR calc non Af Amer: >60
Glucose, Bld: 114 — ABNORMAL HIGH

## 2010-12-20 LAB — DIFFERENTIAL
Basophils Absolute: 0.1
Basophils Relative: 0
Eosinophils Absolute: 0.1
Eosinophils Relative: 1
Lymphocytes Relative: 27
Monocytes Absolute: 0.4
Monocytes Relative: 3
Neutro Abs: 8.4 — ABNORMAL HIGH
Neutrophils Relative %: 66

## 2010-12-20 LAB — COMPREHENSIVE METABOLIC PANEL
ALT: 14
ALT: 15
AST: 16
Albumin: 3.7
Albumin: 3.7
Alkaline Phosphatase: 71
CO2: 22
Calcium: 8.9
Chloride: 109
GFR calc Af Amer: >60
GFR calc non Af Amer: >60
Glucose, Bld: 133 — ABNORMAL HIGH
Potassium: 3.9
Sodium: 138
Sodium: 139
Total Bilirubin: 0.5
Total Protein: 6.3
Total Protein: 6.6

## 2010-12-20 LAB — URINALYSIS, ROUTINE W REFLEX MICROSCOPIC
Hgb urine dipstick: NEGATIVE
Specific Gravity, Urine: 1.02
Urobilinogen, UA: 0.2

## 2010-12-20 LAB — CLOSTRIDIUM DIFFICILE EIA: C difficile Toxins A+B, EIA: NEGATIVE

## 2010-12-20 LAB — CBC
Hemoglobin: 13.2
Platelets: 328
RDW: 13
RDW: 13.2
WBC: 13.9 — ABNORMAL HIGH

## 2010-12-20 LAB — STOOL CULTURE

## 2010-12-20 LAB — FECAL LACTOFERRIN, QUANT: Fecal Lactoferrin: NEGATIVE

## 2010-12-20 LAB — POTASSIUM: Potassium: 3.7

## 2011-11-16 ENCOUNTER — Ambulatory Visit (HOSPITAL_COMMUNITY): Payer: 59 | Admitting: Psychiatry

## 2012-03-01 ENCOUNTER — Encounter: Payer: Self-pay | Admitting: Internal Medicine

## 2012-03-04 ENCOUNTER — Ambulatory Visit: Payer: Self-pay | Admitting: Urgent Care

## 2012-03-04 ENCOUNTER — Telehealth: Payer: Self-pay | Admitting: Urgent Care

## 2012-03-04 NOTE — Telephone Encounter (Signed)
Please offer reschedule Thanks 

## 2012-03-04 NOTE — Telephone Encounter (Signed)
Pt was a no show

## 2012-08-05 ENCOUNTER — Inpatient Hospital Stay (HOSPITAL_COMMUNITY)
Admission: EM | Admit: 2012-08-05 | Discharge: 2012-08-07 | DRG: 641 | Disposition: A | Discharge status: Home / Self Care | Payer: 59 | Attending: Internal Medicine | Admitting: Internal Medicine

## 2012-08-05 ENCOUNTER — Emergency Department (HOSPITAL_COMMUNITY): Payer: 59

## 2012-08-05 ENCOUNTER — Encounter (HOSPITAL_COMMUNITY): Payer: Self-pay

## 2012-08-05 ENCOUNTER — Inpatient Hospital Stay (HOSPITAL_COMMUNITY): Payer: 59

## 2012-08-05 DIAGNOSIS — M1990 Unspecified osteoarthritis, unspecified site: Secondary | ICD-10-CM

## 2012-08-05 DIAGNOSIS — Z9189 Other specified personal risk factors, not elsewhere classified: Secondary | ICD-10-CM

## 2012-08-05 DIAGNOSIS — Z880 Allergy status to penicillin: Secondary | ICD-10-CM

## 2012-08-05 DIAGNOSIS — J4489 Other specified chronic obstructive pulmonary disease: Secondary | ICD-10-CM

## 2012-08-05 DIAGNOSIS — F341 Dysthymic disorder: Secondary | ICD-10-CM

## 2012-08-05 DIAGNOSIS — R110 Nausea: Secondary | ICD-10-CM

## 2012-08-05 DIAGNOSIS — I509 Heart failure, unspecified: Secondary | ICD-10-CM

## 2012-08-05 DIAGNOSIS — D638 Anemia in other chronic diseases classified elsewhere: Secondary | ICD-10-CM | POA: Diagnosis present

## 2012-08-05 DIAGNOSIS — M129 Arthropathy, unspecified: Secondary | ICD-10-CM | POA: Diagnosis present

## 2012-08-05 DIAGNOSIS — Z87891 Personal history of nicotine dependence: Secondary | ICD-10-CM

## 2012-08-05 DIAGNOSIS — J449 Chronic obstructive pulmonary disease, unspecified: Secondary | ICD-10-CM

## 2012-08-05 DIAGNOSIS — R531 Weakness: Secondary | ICD-10-CM

## 2012-08-05 DIAGNOSIS — R5383 Other fatigue: Secondary | ICD-10-CM

## 2012-08-05 DIAGNOSIS — Z886 Allergy status to analgesic agent status: Secondary | ICD-10-CM

## 2012-08-05 DIAGNOSIS — Z862 Personal history of diseases of the blood and blood-forming organs and certain disorders involving the immune mechanism: Secondary | ICD-10-CM

## 2012-08-05 DIAGNOSIS — R29898 Other symptoms and signs involving the musculoskeletal system: Secondary | ICD-10-CM

## 2012-08-05 DIAGNOSIS — E876 Hypokalemia: Principal | ICD-10-CM

## 2012-08-05 DIAGNOSIS — K219 Gastro-esophageal reflux disease without esophagitis: Secondary | ICD-10-CM

## 2012-08-05 DIAGNOSIS — R197 Diarrhea, unspecified: Secondary | ICD-10-CM

## 2012-08-05 DIAGNOSIS — R630 Anorexia: Secondary | ICD-10-CM

## 2012-08-05 DIAGNOSIS — R109 Unspecified abdominal pain: Secondary | ICD-10-CM

## 2012-08-05 HISTORY — DX: Heart failure, unspecified: I50.9

## 2012-08-05 LAB — CBC WITH DIFFERENTIAL/PLATELET
Basophils Relative: 0 % (ref 0–1)
Hemoglobin: 9.5 g/dL — ABNORMAL LOW (ref 12.0–15.0)
MCHC: 32 g/dL (ref 30.0–36.0)
Monocytes Relative: 4 % (ref 3–12)
Neutro Abs: 6.3 10*3/uL (ref 1.7–7.7)
Neutrophils Relative %: 71 % (ref 43–77)
RBC: 3.48 MIL/uL — ABNORMAL LOW (ref 3.87–5.11)

## 2012-08-05 LAB — HEPATIC FUNCTION PANEL
AST: 23 U/L (ref 0–37)
Albumin: 3.5 g/dL (ref 3.5–5.2)
Total Protein: 7.2 g/dL (ref 6.0–8.3)

## 2012-08-05 LAB — BASIC METABOLIC PANEL
BUN: 8 mg/dL (ref 6–23)
CO2: 20 mEq/L (ref 19–32)
Calcium: 7.7 mg/dL — ABNORMAL LOW (ref 8.4–10.5)
Chloride: 103 mEq/L (ref 96–112)
Chloride: 110 mEq/L (ref 96–112)
GFR calc Af Amer: >90 mL/min (ref >90)
Potassium: <2 mEq/L — CL (ref 3.5–5.1)
Potassium: <2 mEq/L — CL (ref 3.5–5.1)
Sodium: 143 mEq/L (ref 135–145)

## 2012-08-05 LAB — CK: Total CK: 500 U/L — ABNORMAL HIGH (ref 7–177)

## 2012-08-05 LAB — URINALYSIS, ROUTINE W REFLEX MICROSCOPIC
Leukocytes, UA: NEGATIVE
Nitrite: NEGATIVE
Specific Gravity, Urine: 1.01 (ref 1.005–1.030)
pH: 5.5 (ref 5.0–8.0)

## 2012-08-05 LAB — NA AND K (SODIUM & POTASSIUM), RAND UR: Potassium Urine: 9 mEq/L

## 2012-08-05 LAB — TROPONIN I: Troponin I: <0.3 ng/mL (ref <0.30)

## 2012-08-05 LAB — MAGNESIUM: Magnesium: 1.9 mg/dL (ref 1.5–2.5)

## 2012-08-05 MED ORDER — ALBUTEROL SULFATE (5 MG/ML) 0.5% IN NEBU: 2.5000 mg | INHALATION_SOLUTION | RESPIRATORY_TRACT | Status: DC | PRN

## 2012-08-05 MED ORDER — MAGNESIUM OXIDE 400 (241.3 MG) MG PO TABS
800.0000 mg | ORAL_TABLET | Freq: Two times a day (BID) | ORAL | Status: AC
  Administered 2012-08-05 (×2): 800 mg via ORAL
  Filled 2012-08-05 (×3): qty 2

## 2012-08-05 MED ORDER — SODIUM CHLORIDE 0.9 % IJ SOLN
3.0000 mL | Freq: Two times a day (BID) | INTRAMUSCULAR | Status: DC
  Administered 2012-08-05 – 2012-08-06 (×2): 3 mL via INTRAVENOUS
  Filled 2012-08-05: qty 3

## 2012-08-05 MED ORDER — GUAIFENESIN-DM 100-10 MG/5ML PO SYRP: 5.0000 mL | ORAL_SOLUTION | ORAL | Status: DC | PRN

## 2012-08-05 MED ORDER — ENOXAPARIN SODIUM 40 MG/0.4ML ~~LOC~~ SOLN
40.0000 mg | SUBCUTANEOUS | Status: DC
  Administered 2012-08-05 – 2012-08-06 (×2): 40 mg via SUBCUTANEOUS
  Filled 2012-08-05 (×2): qty 0.4

## 2012-08-05 MED ORDER — POTASSIUM CHLORIDE CRYS ER 20 MEQ PO TBCR
40.0000 meq | EXTENDED_RELEASE_TABLET | Freq: Once | ORAL | Status: AC
  Administered 2012-08-05: 40 meq via ORAL
  Filled 2012-08-05: qty 2

## 2012-08-05 MED ORDER — POTASSIUM CHLORIDE 10 MEQ/100ML IV SOLN
10.0000 meq | INTRAVENOUS | Status: AC
  Administered 2012-08-05 – 2012-08-06 (×6): 10 meq via INTRAVENOUS
  Filled 2012-08-05: qty 200
  Filled 2012-08-05: qty 400

## 2012-08-05 MED ORDER — POTASSIUM CHLORIDE 10 MEQ/100ML IV SOLN
10.0000 meq | INTRAVENOUS | Status: DC
  Administered 2012-08-05 (×2): 10 meq via INTRAVENOUS
  Filled 2012-08-05 (×2): qty 100

## 2012-08-05 MED ORDER — HYDROCODONE-ACETAMINOPHEN 5-325 MG PO TABS
1.0000 | ORAL_TABLET | ORAL | Status: DC | PRN
  Administered 2012-08-05 (×3): 1 via ORAL
  Administered 2012-08-06 (×2): 2 via ORAL
  Administered 2012-08-06: 1 via ORAL
  Administered 2012-08-06: 2 via ORAL
  Administered 2012-08-06: 1 via ORAL
  Administered 2012-08-07 (×2): 2 via ORAL
  Filled 2012-08-05: qty 2
  Filled 2012-08-05: qty 1
  Filled 2012-08-05: qty 2
  Filled 2012-08-05 (×2): qty 1
  Filled 2012-08-05: qty 2
  Filled 2012-08-05 (×2): qty 1
  Filled 2012-08-05 (×2): qty 2

## 2012-08-05 MED ORDER — POTASSIUM CHLORIDE 10 MEQ/100ML IV SOLN
10.0000 meq | INTRAVENOUS | Status: DC
  Administered 2012-08-05: 10 meq via INTRAVENOUS
  Filled 2012-08-05: qty 100

## 2012-08-05 MED ORDER — IBUPROFEN 800 MG PO TABS
800.0000 mg | ORAL_TABLET | Freq: Once | ORAL | Status: AC
  Administered 2012-08-05: 800 mg via ORAL
  Filled 2012-08-05: qty 1

## 2012-08-05 MED ORDER — POLYETHYLENE GLYCOL 3350 17 G PO PACK: 17.0000 g | PACK | Freq: Every day | ORAL | Status: DC | PRN

## 2012-08-05 MED ORDER — POTASSIUM CHLORIDE CRYS ER 20 MEQ PO TBCR
40.0000 meq | EXTENDED_RELEASE_TABLET | Freq: Three times a day (TID) | ORAL | Status: AC
  Administered 2012-08-05 – 2012-08-06 (×3): 40 meq via ORAL
  Filled 2012-08-05 (×3): qty 2

## 2012-08-05 MED ORDER — ASPIRIN EC 325 MG PO TBEC
325.0000 mg | DELAYED_RELEASE_TABLET | Freq: Every day | ORAL | Status: DC
  Administered 2012-08-05 – 2012-08-07 (×3): 325 mg via ORAL
  Filled 2012-08-05 (×3): qty 1

## 2012-08-05 MED ORDER — PROMETHAZINE HCL 12.5 MG PO TABS: 25.0000 mg | ORAL_TABLET | Freq: Four times a day (QID) | ORAL | Status: DC | PRN

## 2012-08-05 MED ORDER — SODIUM CHLORIDE 0.9 % IV SOLN
INTRAVENOUS | Status: AC
  Administered 2012-08-05 – 2012-08-06 (×2): via INTRAVENOUS
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
## 2012-08-05 NOTE — ED Notes (Signed)
CRITICAL VALUE ALERT  Critical value received:  Potassium less than 2.0, unable to give exact amount  Date of notification:  08/05/12  Time of notification:  1523  Critical value read back:yes  Nurse who received alert:  c Dania Marsan rn  MD notified (1st page):  Dr Thedore Mins  Time of first page:  1523  MD notified (2nd page):  Time of second page:  Responding MD:  Dr Thedore Mins  Time MD responded:  [phone]

## 2012-08-05 NOTE — H&P (Addendum)
[facility]                                                                                    Patient Demographics  [name], is a 47 y.o. female  CSN: [id number]  MRN: [id number]  DOB - [birth year]  Admit Date - 08/05/2012  Outpatient Primary MD for the patient is No PCP Per Patient   With History of -  Past Medical History  Diagnosis Date  . COPD (chronic obstructive pulmonary disease)   . Asthma   . Anemia   . Arthritis   . CHF (congestive heart failure)       Past Surgical History  Procedure Laterality Date  . Abdominal hysterectomy    . Cholecystectomy    . Hip surgery    . Knee surgery    . Ectopic pregnancy surgery    . Esophagogastroduodenoscopy  07/16/2007    QIO:NGEXBM esophagus, small hiatal  hernia, and submucosal petechial hemorrhages secondary to heaving  . Colonoscopy  07/16/2007    RMR:  Normal rectum, colon, and terminal ileum.  Poor  . Hernia repair      in for   Chief Complaint  Patient presents with  . Weakness     HPI  [name]  is a 47 y.o. female, with history of chronic nonspecific CHF no echo in chart, smoking history quit 8 months ago, COPD, who was in her usual state of health, does not take any prescription medications, presents with 24 hour history of generalized weakness and body aches, weakness is gradually progressive, upper extremities worse than the lower, worst in the left upper extremity, patient almost developed on hand in both hands more pronounced in the left hand than the right, he presented to the ER where workup was suggestive of severe hypokalemia with a potassium of 1.3, head CT was unremarkable, I was called to admit the patient.    Review of Systems    In addition to the HPI above,   No Fever-chills, No Headache, No changes with Vision or hearing, No problems swallowing food or Liquids, No Chest pain, Cough or Shortness of Breath, No Abdominal pain, No Nausea or Vommitting, Bowel movements  are regular, No Blood in stool or Urine, No dysuria, No new skin rashes or bruises, No new joints pains-aches,  Generalized weakness, upper extremity worse than lower, left arm worse than right, generalized bodyaches No recent weight gain or loss, No polyuria, polydypsia or polyphagia, No significant Mental Stressors.  A full 10 point Review of Systems was done, except as stated above, all other Review of Systems were negative.   Social History History  Substance Use Topics  . Smoking status: Current Every Day Smoker -- 1.00 packs/day    Types: Cigarettes  . Smokeless tobacco: Not on file  . Alcohol Use: No      Family History No history of CVA or CAD  Prior to Admission medications   Medication Sig Start Date End Date Taking? Authorizing Provider  albuterol (VENTOLIN HFA) 108 (90 BASE) MCG/ACT inhaler Inhale 2 puffs into the lungs daily as needed. Depending on activity for COPD    Yes Historical Provider, MD  ibuprofen (ADVIL,MOTRIN) 200 MG tablet Take 600 mg by mouth every 6 (six) hours as needed for pain.   Yes Historical Provider, MD    Allergies  Allergen Reactions  . Codeine     REACTION: hives/itch  . Penicillins Other (See Comments)    UNKNOWN CHILDHOOD ALLERGY  . Zofran (Ondansetron Hcl) Itching and Rash    Physical Exam  Vitals  Blood pressure 127/63, pulse 59, temperature 97.2 F (36.2 C), temperature source Oral, resp. rate 18, height 5\' 4"  (1.626 m), weight 72.576 kg (160 lb), SpO2 100.00%.   1. General middle aged white female lying in bed in NAD,    2. Normal affect and insight, Not Suicidal or Homicidal, Awake Alert, Oriented X 3.  3. No F.N deficits, ALL C.Nerves Intact, she has generalized weakness all over upper extremities weaker than the lower, left arm is 3/5 right arm is 4/5 lower extremities are 4/5 , Sensation intact all 4 extremities, Plantars down going.  4. Ears and Eyes appear Normal, Conjunctivae clear, PERRLA. Moist Oral  Mucosa.  5. Supple Neck, No JVD, No cervical lymphadenopathy appriciated, No Carotid Bruits.  6. Symmetrical Chest wall movement, Good air movement bilaterally, CTAB.  7. RRR, No Gallops, Rubs or Murmurs, No Parasternal Heave.  8. Positive Bowel Sounds, Abdomen Soft, Non tender, No organomegaly appriciated,No rebound -guarding or rigidity.  9.  No Cyanosis, Normal Skin Turgor, No Skin Rash or Bruise.  10. Good muscle tone,  joints appear normal , no effusions, Normal ROM.  11. No Palpable Lymph Nodes in Neck or Axillae    Data Review  CBC  Recent Labs Lab 08/05/12 1112  WBC 8.8  HGB 9.5*  HCT 29.7*  PLT 514*  MCV 85.3  MCH 27.3  MCHC 32.0  RDW 14.9  LYMPHSABS 1.7  MONOABS 0.4  EOSABS 0.5  BASOSABS 0.0   ------------------------------------------------------------------------------------------------------------------  Chemistries   Recent Labs Lab 08/05/12 1112 08/05/12 1132  NA 140  --   K 1.3*  --   CL 103  --   CO2 21  --   GLUCOSE 127*  --   BUN 8  --   CREATININE 0.75  --   CALCIUM 9.1  --   AST  --  23  ALT  --  9  ALKPHOS  --  82  BILITOT  --  0.1*   ------------------------------------------------------------------------------------------------------------------ estimated creatinine clearance is 85 ml/min (by C-G formula based on Cr of 0.75). ------------------------------------------------------------------------------------------------------------------ No results found for this basename: TSH, T4TOTAL, FREET3, T3FREE, THYROIDAB,  in the last 72 hours   Coagulation profile No results found for this basename: INR, PROTIME,  in the last 168 hours ------------------------------------------------------------------------------------------------------------------- No results found for this basename: DDIMER,  in the last 72  hours -------------------------------------------------------------------------------------------------------------------  Cardiac Enzymes No results found for this basename: CK, CKMB, TROPONINI, MYOGLOBIN,  in the last 168 hours ------------------------------------------------------------------------------------------------------------------ No components found with this basename: POCBNP,    ---------------------------------------------------------------------------------------------------------------  Urinalysis    Component Value Date/Time   COLORURINE YELLOW 08/05/2012 1232   APPEARANCEUR CLEAR 08/05/2012 1232   LABSPEC 1.010 08/05/2012 1232   PHURINE 5.5 08/05/2012 1232   GLUCOSEU NEGATIVE 08/05/2012 1232   HGBUR NEGATIVE 08/05/2012 1232   BILIRUBINUR NEGATIVE 08/05/2012 1232   KETONESUR NEGATIVE 08/05/2012 1232   PROTEINUR NEGATIVE 08/05/2012 1232   UROBILINOGEN 0.2 08/05/2012 1232   NITRITE NEGATIVE 08/05/2012 1232   LEUKOCYTESUR NEGATIVE 08/05/2012 1232    ----------------------------------------------------------------------------------------------------------------  Imaging results:   Ct Head Wo Contrast  08/05/2012  *  RADIOLOGY REPORT*  Clinical Data: Weakness of the left leg and arm  CT HEAD WITHOUT CONTRAST  Technique:  Contiguous axial images were obtained from the base of the skull through the vertex without contrast.  Comparison: None.  Findings: The brain does not show atrophy.  There is no evidence of old or acute infarction, mass lesion, hemorrhage, hydrocephalus or extra-axial collection.  The calvarium is unremarkable.  Sinuses, middle ears and mastoids are clear.  IMPRESSION: Normal head CT.   Original Report Authenticated By: Paulina Fusi, M.D.    Dg Chest Portable 1 View  08/05/2012  *RADIOLOGY REPORT*  Clinical Data: Weakness, hemoptysis, former smoker, history CHF, asthma, COPD  PORTABLE CHEST - 1 VIEW  Comparison: Portable exam 1111 hours compared to 01/12/2009   Findings: Upper-normal size of cardiac silhouette. Mediastinal contours and pulmonary vascularity normal. Lungs clear. Tip of left lung apex excluded. No pleural effusion or gross evidence of pneumothorax. Bones demineralized.  IMPRESSION: No acute abnormalities.   Original Report Authenticated By: Ulyses Southward, M.D.     My personal review of EKG: Rhythm NSR, non specific ST changes, ? u waves lat leads.    Assessment & Plan   1. Generalized weakness worse in the left upper extremity. However the pattern of weakness is generalized, patient has severe hypokalemia which can certainly be conduit into it, head CT is unremarkable, I doubt this is a CVA, however will check MRI MRA of the brain, lipid panel and A1c, PT OT to evaluate, and continue to monitor clinically once potassium has normalized.   2. Severe hypokalemia reason is unclear, she is on no medications, no history of diarrhea nausea vomiting, will check urinary sodium and potassium, potassium will be replaced aggressively IV and orally, will also check a magnesium along with ionized calcium level, she will be monitored on telemetry and potassium will be monitored closely, will repeat a potassium level tonight and then again tomorrow morning. Check CK levels. IVF.   3. History of asthma, anemia of chronic disease. No acute issues, as needed mobilize her treatments and oxygen. No requirement for transfusion at this time. Outpatient followup with PCP for anemia post discharge.    4. History of smoking quit 8 months ago, counseled to continue abstaining from smoking.    DVT Prophylaxis  Lovenox   AM Labs Ordered, also please review Full Orders  Family Communication: Admission, patients condition and plan of care including tests being ordered have been discussed with the patient and family who indicate understanding and agree with the plan and Code Status.  Code Status full  Likely DC to  home  Time spent in minutes :  35  Condition [name] K M.D on 08/05/2012 at 2:41 PM  Between 7am to 7pm - Pager - [phone]  After 7pm go to [email or web address] - password [email or web address]  And look for the night coverage person covering me after hours  [facility] Office  [phone]

## 2012-08-05 NOTE — ED Notes (Signed)
MRI came to get pt. Called Dr Thedore Mins to see if he wanted to stop potassium for the MRI. Dr. Thedore Mins stated to do MRI tomorrow and to not stop potassium. Advised could do MRI after 5th run of potassium but Dr Thedore Mins stated to just do the MRI tomorrow. MRI receiving floor nurse aware.

## 2012-08-05 NOTE — ED Notes (Signed)
Pt woke yesterday at 9am with left arm and left leg weakness. No difficulty breathing, or cp, no headache. No difficulty swallowing or speech problems.

## 2012-08-05 NOTE — ED Notes (Signed)
hospitalist in with pt at this time. Pharmacy called for magnesium PO.

## 2012-08-05 NOTE — ED Notes (Signed)
CRITICAL VALUE ALERT  Critical value received:  K 1.3 Date of notification:  08/05/2012  Time of notification:  1246  Critical value read back:yes  Nurse who received alert:  L. Elmer Picker RN  MD notified (1st page): Dr. Adriana Simas  Time of first page:  1246  MD notified (2nd page):  Time of second page:  Responding MD:  Dr. Adriana Simas   Time MD responded:  [phone]

## 2012-08-06 DIAGNOSIS — R630 Anorexia: Secondary | ICD-10-CM

## 2012-08-06 DIAGNOSIS — I509 Heart failure, unspecified: Secondary | ICD-10-CM

## 2012-08-06 DIAGNOSIS — M129 Arthropathy, unspecified: Secondary | ICD-10-CM

## 2012-08-06 DIAGNOSIS — R109 Unspecified abdominal pain: Secondary | ICD-10-CM

## 2012-08-06 DIAGNOSIS — I517 Cardiomegaly: Secondary | ICD-10-CM

## 2012-08-06 LAB — CBC
HCT: 25 % — ABNORMAL LOW (ref 36.0–46.0)
Hemoglobin: 8 g/dL — ABNORMAL LOW (ref 12.0–15.0)
MCH: 27.2 pg (ref 26.0–34.0)
MCHC: 32 g/dL (ref 30.0–36.0)
MCV: 85 fL (ref 78.0–100.0)

## 2012-08-06 LAB — HEMOGLOBIN A1C
Hgb A1c MFr Bld: 5.5 % (ref <5.7)
Mean Plasma Glucose: 111 mg/dL (ref <117)

## 2012-08-06 LAB — MAGNESIUM: Magnesium: 1.9 mg/dL (ref 1.5–2.5)

## 2012-08-06 LAB — LIPID PANEL
Cholesterol: 157 mg/dL (ref 0–200)
HDL: 43 mg/dL (ref >39)
Triglycerides: 238 mg/dL — ABNORMAL HIGH (ref <150)

## 2012-08-06 LAB — BLOOD GAS, ARTERIAL
Acid-Base Excess: 0.2 mmol/L (ref 0.0–2.0)
Drawn by: 27407
FIO2: 0.21 %
O2 Saturation: 98.4 %
Patient temperature: 37
pO2, Arterial: 114 mmHg — ABNORMAL HIGH (ref 80.0–100.0)

## 2012-08-06 LAB — ROCKY MTN SPOTTED FVR AB, IGG-BLOOD: RMSF IgG: 0.42 IV

## 2012-08-06 LAB — BASIC METABOLIC PANEL
BUN: 5 mg/dL — ABNORMAL LOW (ref 6–23)
CO2: 22 mEq/L (ref 19–32)
GFR calc non Af Amer: >90 mL/min (ref >90)
Glucose, Bld: 117 mg/dL — ABNORMAL HIGH (ref 70–99)
Potassium: <2 mEq/L — CL (ref 3.5–5.1)

## 2012-08-06 LAB — POTASSIUM: Potassium: <2 mEq/L — CL (ref 3.5–5.1)

## 2012-08-06 MED ORDER — POTASSIUM CHLORIDE 10 MEQ/100ML IV SOLN
10.0000 meq | INTRAVENOUS | Status: DC
  Administered 2012-08-06 (×2): 10 meq via INTRAVENOUS
  Filled 2012-08-06 (×2): qty 100

## 2012-08-06 MED ORDER — POTASSIUM CHLORIDE 10 MEQ/100ML IV SOLN
INTRAVENOUS | Status: AC
  Filled 2012-08-06: qty 100

## 2012-08-06 MED ORDER — MAGNESIUM SULFATE 40 MG/ML IJ SOLN
2.0000 g | Freq: Once | INTRAMUSCULAR | Status: AC
  Administered 2012-08-06: 2 g via INTRAVENOUS
  Filled 2012-08-06: qty 50

## 2012-08-06 MED ORDER — POTASSIUM CHLORIDE 10 MEQ/100ML IV SOLN
INTRAVENOUS | Status: AC
  Administered 2012-08-06: 10 meq via INTRAVENOUS
  Filled 2012-08-06: qty 100

## 2012-08-06 MED ORDER — POTASSIUM CHLORIDE 10 MEQ/100ML IV SOLN
10.0000 meq | INTRAVENOUS | Status: AC
  Administered 2012-08-06 (×6): 10 meq via INTRAVENOUS
  Filled 2012-08-06 (×6): qty 100

## 2012-08-06 MED ORDER — POTASSIUM CHLORIDE CRYS ER 20 MEQ PO TBCR
40.0000 meq | EXTENDED_RELEASE_TABLET | Freq: Three times a day (TID) | ORAL | Status: DC
  Administered 2012-08-06: 40 meq via ORAL
  Filled 2012-08-06: qty 2

## 2012-08-06 MED ORDER — SODIUM CHLORIDE 0.9 % IV SOLN
1.0000 g | Freq: Once | INTRAVENOUS | Status: AC
  Administered 2012-08-06: 1 g via INTRAVENOUS
  Filled 2012-08-06: qty 10

## 2012-08-06 MED ORDER — MAGNESIUM SULFATE IN D5W 10-5 MG/ML-% IV SOLN
1.0000 g | Freq: Once | INTRAVENOUS | Status: AC
  Administered 2012-08-06: 1 g via INTRAVENOUS
  Filled 2012-08-06: qty 100

## 2012-08-06 MED ORDER — POTASSIUM CHLORIDE IN NACL 20-0.45 MEQ/L-% IV SOLN
INTRAVENOUS | Status: DC
  Administered 2012-08-06: 21:00:00 via INTRAVENOUS
  Filled 2012-08-06 (×2): qty 1000

## 2012-08-06 MED ORDER — POTASSIUM CHLORIDE CRYS ER 20 MEQ PO TBCR
40.0000 meq | EXTENDED_RELEASE_TABLET | Freq: Two times a day (BID) | ORAL | Status: DC
  Administered 2012-08-06: 40 meq via ORAL
  Filled 2012-08-06: qty 2

## 2012-08-06 MED ORDER — POTASSIUM CHLORIDE IN NACL 20-0.45 MEQ/L-% IV SOLN
INTRAVENOUS | Status: AC
  Filled 2012-08-06: qty 1000

## 2012-08-06 MED ORDER — POTASSIUM CHLORIDE CRYS ER 20 MEQ PO TBCR
40.0000 meq | EXTENDED_RELEASE_TABLET | Freq: Three times a day (TID) | ORAL | Status: DC
  Filled 2012-08-06: qty 2

## 2012-08-06 NOTE — Progress Notes (Signed)
Received call from lab potassium level < 2.0 ( unreadable on there machine) blood was drawn prior to completion of ordered potassium IV supplement, MD will be made aware and repeat potassium level with be obtained at 0500.

## 2012-08-06 NOTE — Progress Notes (Signed)
Received call from lab patient's potassium 1.7 this morning, Dr. Phillips Odor made aware, see new orders

## 2012-08-06 NOTE — Progress Notes (Signed)
Utilization Review Complete  

## 2012-08-06 NOTE — Evaluation (Signed)
Physical Therapy Evaluation Patient Details Name: [name] MRN: [id number] DOB: May 08, 1965 Today's Date: 08/06/2012 Time: 1025-1100 PT Time Calculation (min): 35 min  PT Assessment / Plan / Recommendation Clinical Impression  Pt is a 47 yo admitted with complaint of weakness.  Pt appears to be at prior functioning level and does not need any skilled therapy.  Pt may benefti from OP to maximize functional ability.    PT Assessment  Patent does not need any further PT services    Follow Up Recommendations  Outpatient PT    Does the patient have the potential to tolerate intense rehabilitation    N/A  Barriers to Discharge  none      Equipment Recommendations  None recommended by PT    Recommendations for Other Services  none   Frequency  no acute needs    Precautions / Restrictions Precautions Precautions: None Restrictions Weight Bearing Restrictions: No         Mobility  Bed Mobility Bed Mobility: Rolling Right;Rolling Left;Supine to Sit Rolling Right: 6: Modified independent (Device/Increase time) Rolling Left: 6: Modified independent (Device/Increase time) Supine to Sit: 6: Modified independent (Device/Increase time) Transfers Transfers: Sit to Stand Sit to Stand: 7: Independent Ambulation/Gait Ambulation/Gait Assistance: 7: Independent Ambulation Distance (Feet): 200 Feet Assistive device: None Gait Pattern: Within Functional Limits Gait velocity: normal    Exercises General Exercises - Lower Extremity Hip ABduction/ADduction: Right;Left;Sidelying;10 reps Straight Leg Raises: Right;Left;10 reps   Visit Information  Last PT Received On: 08/06/12    Subjective Data  Subjective: Go Home   Prior Functioning  Home Living Lives With: Spouse Available Help at Discharge: Family Type of Home: House Home Access: Stairs to enter Secretary/administrator of Steps: 1 Entrance Stairs-Rails: None Home Layout: One level Firefighter: Standard Home  Adaptive Equipment: None Prior Function Level of Independence: Independent Able to Take Stairs?: Yes Vocation: Unemployed Communication Communication: No difficulties    Cognition  Cognition Arousal/Alertness: Awake/alert Overall Cognitive Status: Within Functional Limits for tasks assessed    Extremity/Trunk Assessment Right Lower Extremity Assessment RLE ROM/Strength/Tone: [facility] for tasks assessed Left Lower Extremity Assessment LLE ROM/Strength/Tone: WFL for tasks assessed   Balance    End of Session PT - End of Session Equipment Utilized During Treatment: Gait belt Activity Tolerance: Patient tolerated treatment well Patient left: in chair  GP     Seth Higginbotham,CINDY 08/06/2012, 11:05 AM

## 2012-08-06 NOTE — Care Management Note (Signed)
    Page 1 of 1   08/07/2012     4:07:25 PM   CARE MANAGEMENT NOTE 08/07/2012  Patient:  NEL, STONEKING   Account Number:  [id number]  Date Initiated:  08/06/2012  Documentation initiated by:  Rosemary Holms  Subjective/Objective Assessment:   Pt admitted from home where she lives with her spouse. CM spoke to pt and her mother at bedside. CM mentioned that PT had recommended Outpt PT at DC and mother commented that transportation would not be a problem.     Action/Plan:   Anticipated DC Date:  08/07/2012   Anticipated DC Plan:  HOME/SELF CARE      DC Planning Services  CM consult      Choice offered to / List presented to:             Status of service:  Completed, signed off Medicare Important Message given?   (If response is "NO", the following Medicare IM given date fields will be blank) Date Medicare IM given:   Date Additional Medicare IM given:    Discharge Disposition:  HOME/SELF CARE  Per UR Regulation:    If discussed at Long Length of Stay Meetings, dates discussed:    Comments:  08/06/12 Rosemary Holms RN BSN CM 08/07/12 Lilyauna Miedema Leanord Hawking RN BSN CM Per Maralyn Sago RN, pt to f/u with Dr. Rhodia Albright in 2 days for [facility]

## 2012-08-06 NOTE — Progress Notes (Signed)
*  PRELIMINARY RESULTS* Echocardiogram 2D Echocardiogram has been performed.  [name] 08/06/2012, 10:15 AM 

## 2012-08-06 NOTE — Consult Note (Signed)
Reason for Consult: Hypokalemia Referring Physician: Dr.K Davonna Belling is an 47 y.o. female.  HPI: She is a patient with significant past medical history presently had came because of severe muscle ache, weakness and muscle spasm of 1 day duration. According to the patient she has no previous problem of hypertension of hypokalemia. Patient has been doing very well the day prior to admission. When she wakes up she start having the above problem. Patient denies any nausea no vomiting and no history of diarrhea. Patient also denies any history of diuretic use. Patient doesn't have any fever and there is no family history of hypokalemia. Patient blood pressure is well controlled not taking any medications. Looking at her previous emergency room encounter patient seems to have multiple medical problems including anxiety disorder, ulcers and this appears COPD and anorexia with diarrhea.  Past Medical History  Diagnosis Date  . COPD (chronic obstructive pulmonary disease)   . Asthma   . Anemia   . Arthritis   . CHF (congestive heart failure)     Past Surgical History  Procedure Laterality Date  . Abdominal hysterectomy    . Cholecystectomy    . Hip surgery    . Knee surgery    . Ectopic pregnancy surgery    . Esophagogastroduodenoscopy  07/16/2007    ZOX:WRUEAV esophagus, small hiatal  hernia, and submucosal petechial hemorrhages secondary to heaving  . Colonoscopy  07/16/2007    RMR:  Normal rectum, colon, and terminal ileum.  Poor  . Hernia repair      No family history on file.  Social History:  reports that she quit smoking about 8 months ago. Her smokeless tobacco use includes Chew. She reports that she does not drink alcohol or use illicit drugs.  Allergies:  Allergies  Allergen Reactions  . Codeine     REACTION: hives/itch  . Penicillins Other (See Comments)    UNKNOWN CHILDHOOD ALLERGY  . Zofran (Ondansetron Hcl) Itching and Rash    Medications: I have reviewed  the patient's current medications.  Results for orders placed during the hospital encounter of 08/05/12 (from the past 48 hour(s))  CBC WITH DIFFERENTIAL     Status: Abnormal   Collection Time    08/05/12 11:12 AM      Result Value Range   WBC 8.8  4.0 - 10.5 K/uL   RBC 3.48 (*) 3.87 - 5.11 MIL/uL   Hemoglobin 9.5 (*) 12.0 - 15.0 g/dL   HCT 40.9 (*) 81.1 - 91.4 %   MCV 85.3  78.0 - 100.0 fL   MCH 27.3  26.0 - 34.0 pg   MCHC 32.0  30.0 - 36.0 g/dL   RDW 78.2  95.6 - 21.3 %   Platelets 514 (*) 150 - 400 K/uL   Neutrophils Relative % 71  43 - 77 %   Neutro Abs 6.3  1.7 - 7.7 K/uL   Lymphocytes Relative 19  12 - 46 %   Lymphs Abs 1.7  0.7 - 4.0 K/uL   Monocytes Relative 4  3 - 12 %   Monocytes Absolute 0.4  0.1 - 1.0 K/uL   Eosinophils Relative 5  0 - 5 %   Eosinophils Absolute 0.5  0.0 - 0.7 K/uL   Basophils Relative 0  0 - 1 %   Basophils Absolute 0.0  0.0 - 0.1 K/uL  BASIC METABOLIC PANEL     Status: Abnormal   Collection Time    08/05/12 11:12 AM  Result Value Range   Sodium 140  135 - 145 mEq/L   Potassium <2.0 (*) 3.5 - 5.1 mEq/L   Comment: CALLED CORRECTED REPORT TO C.EDWARDS ON 08/05/12 @ 1520 BY C.LOY     CORRECTED ON 05/12 AT 1528: PREVIOUSLY REPORTED AS 1.3 RESULT REPEATED AND VERIFIED CRITICAL RESULT CALLED TO, READ BACK BY AND VERIFIED WITH: CARDWELL,L AT 1245 BY HUFFINES,S ON 08/05/12   Chloride 103  96 - 112 mEq/L   CO2 21  19 - 32 mEq/L   Glucose, Bld 127 (*) 70 - 99 mg/dL   BUN 8  6 - 23 mg/dL   Creatinine, Ser 4.09  0.50 - 1.10 mg/dL   Calcium 9.1  8.4 - 81.1 mg/dL   GFR calc non Af Amer >90  >90 mL/min   GFR calc Af Amer >90  >90 mL/min   Comment:            The eGFR has been calculated     using the CKD EPI equation.     This calculation has not been     validated in all clinical     situations.     eGFR's persistently     <90 mL/min signify     possible Chronic Kidney Disease.  HEPATIC FUNCTION PANEL     Status: Abnormal   Collection Time     08/05/12 11:32 AM      Result Value Range   Total Protein 7.2  6.0 - 8.3 g/dL   Albumin 3.5  3.5 - 5.2 g/dL   AST 23  0 - 37 U/L   ALT 9  0 - 35 U/L   Alkaline Phosphatase 82  39 - 117 U/L   Total Bilirubin 0.1 (*) 0.3 - 1.2 mg/dL   Bilirubin, Direct <9.1  0.0 - 0.3 mg/dL   Indirect Bilirubin NOT CALCULATED  0.3 - 0.9 mg/dL  B. BURGDORFI ANTIBODIES     Status: None   Collection Time    08/05/12 11:32 AM      Result Value Range   B burgdorferi Ab IgG+IgM 0.11     Comment: (NOTE)     Antibody to Borrelia burgdorferi not detected.       ISR = Immune Status Ratio                 <0.90         ISR       Negative                 0.90 - 1.09   ISR       Equivocal                 >=1.10        ISR       Positive  URINALYSIS, ROUTINE W REFLEX MICROSCOPIC     Status: None   Collection Time    08/05/12 12:32 PM      Result Value Range   Color, Urine YELLOW  YELLOW   APPearance CLEAR  CLEAR   Specific Gravity, Urine 1.010  1.005 - 1.030   pH 5.5  5.0 - 8.0   Glucose, UA NEGATIVE  NEGATIVE mg/dL   Hgb urine dipstick NEGATIVE  NEGATIVE   Bilirubin Urine NEGATIVE  NEGATIVE   Ketones, ur NEGATIVE  NEGATIVE mg/dL   Protein, ur NEGATIVE  NEGATIVE mg/dL   Urobilinogen, UA 0.2  0.0 - 1.0 mg/dL   Nitrite NEGATIVE  NEGATIVE   Leukocytes, UA  NEGATIVE  NEGATIVE   Comment: MICROSCOPIC NOT DONE ON URINES WITH NEGATIVE PROTEIN, BLOOD, LEUKOCYTES, NITRITE, OR GLUCOSE <1000 mg/dL.  NA AND K (SODIUM & POTASSIUM), RAND UR     Status: None   Collection Time    08/05/12 12:32 PM      Result Value Range   Sodium, Ur 67     Potassium Urine Timed 9    ROCKY MTN SPOTTED FVR AB, IGG-BLOOD     Status: None   Collection Time    08/05/12  1:12 PM      Result Value Range   RMSF IgG 0.42     Comment: (NOTE)     IV = Index Value     REFERENCE INTERVAL     [facility] Spotted Fever Antibody, IgG     IgG (IV)     Result              Interpretation:     ---------   ---------             ---------------      <0.80       Negative    No significant level of Rickettsia rickettsii                            IgG antibody detected. If clinically                            indicated, repeat sample within 7-14 days.     0.80-1.20   Equivocal   Questionable presence of Rickettsia rickettsii                            IgG detected. Recommend recollecting                            and retesting, if clinically indicated.     >1.20       Positive    Presence of IgG antibody to Rickettsia                            rickettsii detected.  IgG indicates evidence                            of prior infection and may not indicate                            active disease.  BASIC METABOLIC PANEL     Status: Abnormal   Collection Time    08/05/12  2:25 PM      Result Value Range   Sodium 143  135 - 145 mEq/L   Potassium <2.0 (*) 3.5 - 5.1 mEq/L   Comment: CRITICAL RESULT CALLED TO, READ BACK BY AND VERIFIED WITH:     EDWARDS,C ON 08/05/12 AT 1520 BY LOY,C   Chloride 110  96 - 112 mEq/L   CO2 20  19 - 32 mEq/L   Glucose, Bld 106 (*) 70 - 99 mg/dL   BUN 7  6 - 23 mg/dL   Creatinine, Ser 1.61  0.50 - 1.10 mg/dL   Calcium 7.7 (*) 8.4 - 10.5 mg/dL   GFR calc non Af Amer >90  >  90 mL/min   GFR calc Af Amer >90  >90 mL/min   Comment:            The eGFR has been calculated     using the CKD EPI equation.     This calculation has not been     validated in all clinical     situations.     eGFR's persistently     <90 mL/min signify     possible Chronic Kidney Disease.  CK     Status: Abnormal   Collection Time    08/05/12  2:25 PM      Result Value Range   Total CK 500 (*) 7 - 177 U/L  HEMOGLOBIN A1C     Status: None   Collection Time    08/05/12  2:25 PM      Result Value Range   Hemoglobin A1C 5.5  <5.7 %   Comment: (NOTE)                                                                               According to the ADA Clinical Practice Recommendations for 2011, when     HbA1c is used as a screening  test:      >=6.5%   Diagnostic of Diabetes Mellitus               (if abnormal result is confirmed)     5.7-6.4%   Increased risk of developing Diabetes Mellitus     References:Diagnosis and Classification of Diabetes Mellitus,Diabetes     Care,2011,34(Suppl 1):S62-S69 and Standards of Medical Care in             Diabetes - 2011,Diabetes Care,2011,34 (Suppl 1):S11-S61.   Mean Plasma Glucose 111  <117 mg/dL  TROPONIN I     Status: None   Collection Time    08/05/12  2:25 PM      Result Value Range   Troponin I <0.30  <0.30 ng/mL   Comment:            Due to the release kinetics of cTnI,     a negative result within the first hours     of the onset of symptoms does not rule out     myocardial infarction with certainty.     If myocardial infarction is still suspected,     repeat the test at appropriate intervals.  MAGNESIUM     Status: None   Collection Time    08/05/12  2:25 PM      Result Value Range   Magnesium 1.7  1.5 - 2.5 mg/dL  TROPONIN I     Status: None   Collection Time    08/05/12  8:58 PM      Result Value Range   Troponin I <0.30  <0.30 ng/mL   Comment:            Due to the release kinetics of cTnI,     a negative result within the first hours     of the onset of symptoms does not rule out     myocardial infarction with certainty.     If myocardial infarction is still suspected,  repeat the test at appropriate intervals.  POTASSIUM     Status: Abnormal   Collection Time    08/05/12 10:23 PM      Result Value Range   Potassium <2.0 (*) 3.5 - 5.1 mEq/L   Comment: CRITICAL RESULT CALLED TO, READ BACK BY AND VERIFIED WITH:     A. WILSON AT 2302 ON 08/05/12 BY Wynonia Lawman  MAGNESIUM     Status: None   Collection Time    08/05/12 10:23 PM      Result Value Range   Magnesium 1.9  1.5 - 2.5 mg/dL  BASIC METABOLIC PANEL     Status: Abnormal   Collection Time    08/06/12  2:18 AM      Result Value Range   Sodium 143  135 - 145 mEq/L   Potassium <2.0 (*) 3.5  - 5.1 mEq/L   Comment: DELTA CHECK NOTED     RESULT REPEATED AND VERIFIED     CRITICAL RESULT CALLED TO, READ BACK BY AND VERIFIED WITH:     WILSON A AT 0340 ON [id number] BY FORSYTH K   Chloride 108  96 - 112 mEq/L   CO2 22  19 - 32 mEq/L   Glucose, Bld 117 (*) 70 - 99 mg/dL   BUN 5 (*) 6 - 23 mg/dL   Creatinine, Ser 6.21  0.50 - 1.10 mg/dL   Calcium 7.8 (*) 8.4 - 10.5 mg/dL   GFR calc non Af Amer >90  >90 mL/min   GFR calc Af Amer >90  >90 mL/min   Comment:            The eGFR has been calculated     using the CKD EPI equation.     This calculation has not been     validated in all clinical     situations.     eGFR's persistently     <90 mL/min signify     possible Chronic Kidney Disease.  CBC     Status: Abnormal   Collection Time    08/06/12  2:18 AM      Result Value Range   WBC 5.9  4.0 - 10.5 K/uL   RBC 2.94 (*) 3.87 - 5.11 MIL/uL   Hemoglobin 8.0 (*) 12.0 - 15.0 g/dL   HCT 30.8 (*) 65.7 - 84.6 %   MCV 85.0  78.0 - 100.0 fL   MCH 27.2  26.0 - 34.0 pg   MCHC 32.0  30.0 - 36.0 g/dL   RDW 96.2  95.2 - 84.1 %   Platelets 383  150 - 400 K/uL   Comment: DELTA CHECK NOTED  MAGNESIUM     Status: None   Collection Time    08/06/12  2:18 AM      Result Value Range   Magnesium 1.9  1.5 - 2.5 mg/dL  TROPONIN I     Status: None   Collection Time    08/06/12  2:18 AM      Result Value Range   Troponin I <0.30  <0.30 ng/mL   Comment:            Due to the release kinetics of cTnI,     a negative result within the first hours     of the onset of symptoms does not rule out     myocardial infarction with certainty.     If myocardial infarction is still suspected,     repeat the test at appropriate intervals.  POTASSIUM  Status: Abnormal   Collection Time    08/06/12  5:24 AM      Result Value Range   Potassium <2.0 (*) 3.5 - 5.1 mEq/L   Comment: CRITICAL RESULT CALLED TO, READ BACK BY AND VERIFIED WITH:     Gabriel Earing AT 6:25AM ON 08/06/12 BY FESTERMAN,C  LIPID PANEL      Status: Abnormal   Collection Time    08/06/12  5:24 AM      Result Value Range   Cholesterol 157  0 - 200 mg/dL   Triglycerides 161 (*) <150 mg/dL   HDL 43  >09 mg/dL   Total CHOL/HDL Ratio 3.7     VLDL 48 (*) 0 - 40 mg/dL   LDL Cholesterol 66  0 - 99 mg/dL   Comment:            Total Cholesterol/HDL:CHD Risk     Coronary Heart Disease Risk Table                         Men   Women      1/2 Average Risk   3.4   3.3      Average Risk       5.0   4.4      2 X Average Risk   9.6   7.1      3 X Average Risk  23.4   11.0                Use the calculated Patient Ratio     above and the CHD Risk Table     to determine the patient's CHD Risk.                ATP III CLASSIFICATION (LDL):      <100     mg/dL   Optimal      604-540  mg/dL   Near or Above                        Optimal      130-159  mg/dL   Borderline      981-191  mg/dL   High      >478     mg/dL   Very High  POTASSIUM     Status: Abnormal   Collection Time    08/06/12  3:34 PM      Result Value Range   Potassium 2.6 (*) 3.5 - 5.1 mEq/L   Comment: CRITICAL RESULT CALLED TO, READ BACK BY AND VERIFIED WITH:     ROSS,T ON 08/06/12 AT 1620 BY LOY,C    Ct Head Wo Contrast  08/05/2012  *RADIOLOGY REPORT*  Clinical Data: Weakness of the left leg and arm  CT HEAD WITHOUT CONTRAST  Technique:  Contiguous axial images were obtained from the base of the skull through the vertex without contrast.  Comparison: None.  Findings: The brain does not show atrophy.  There is no evidence of old or acute infarction, mass lesion, hemorrhage, hydrocephalus or extra-axial collection.  The calvarium is unremarkable.  Sinuses, middle ears and mastoids are clear.  IMPRESSION: Normal head CT.   Original Report Authenticated By: Paulina Fusi, M.D.    Mr [facility] Wo Contrast  08/05/2012  *RADIOLOGY REPORT*  Clinical Data:  Generalized weakness with hypokinesia.  Congestive heart failure.  MRI HEAD WITHOUT CONTRAST MRA HEAD WITHOUT CONTRAST   Technique:  Multiplanar, multiecho pulse sequences of the brain  and surrounding structures were obtained without intravenous contrast. Angiographic images of the head were obtained using MRA technique without contrast.  Comparison:  CT head earlier in the day.  MRI HEAD  Findings:  No acute stroke or mass lesion.  No hydrocephalus is present.  Normal cerebral volume. There may be early chronic microvascular ischemic change in the subcortical white matter but these scattered lesions are nonspecific.  There is considerable motion degradation on the FLAIR sequence. There is increased T2 signal in the cerebellopontine angles and prepontine cisterns.  I suspect that this is motion related, particularly given the normal CT appearance, but this finding can be present in  subarachnoid hemorrhage.  Correlate clinically for symptoms of sudden onset headache or photophobia.  If the patient has a history of such, it may be prudent to perform a lumbar puncture.  Normal pituitary.  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  Flow voids are maintained in the central vessels. Unremarkable osseous structures.  No acute sinus disease.  Grossly negative orbits and mastoids.  IMPRESSION: No visible acute stroke is evident.  Motion degraded exam with probable artifactual prolonged T2 signal in the basilar cisterns although correlate clinically for signs and symptoms of subarachnoid hemorrhage.  See discussion above.  MRA HEAD  Findings: Motion degraded exam results in misregistration of vessels.  Study marginally diagnostic.  Gross patency of the carotid, vertebral, and basilar arteries is established. Similarly, both middle cerebral arteries, both anterior cerebral arteries, and posterior cerebral arteries display flow related enhancement indicating patency.  Presence or absence of flow limiting stenosis is difficult to exclude on this examination.  IMPRESSION: Motion degraded examination is marginally diagnostic.  Gross  patency of the major intracranial vasculature is established.  See discussion above.   Original Report Authenticated By: Davonna Belling, M.D.    Mr Brain Wo Contrast  08/05/2012  *RADIOLOGY REPORT*  Clinical Data:  Generalized weakness with hypokinesia.  Congestive heart failure.  MRI HEAD WITHOUT CONTRAST MRA HEAD WITHOUT CONTRAST  Technique:  Multiplanar, multiecho pulse sequences of the brain and surrounding structures were obtained without intravenous contrast. Angiographic images of the head were obtained using MRA technique without contrast.  Comparison:  CT head earlier in the day.  MRI HEAD  Findings:  No acute stroke or mass lesion.  No hydrocephalus is present.  Normal cerebral volume. There may be early chronic microvascular ischemic change in the subcortical white matter but these scattered lesions are nonspecific.  There is considerable motion degradation on the FLAIR sequence. There is increased T2 signal in the cerebellopontine angles and prepontine cisterns.  I suspect that this is motion related, particularly given the normal CT appearance, but this finding can be present in  subarachnoid hemorrhage.  Correlate clinically for symptoms of sudden onset headache or photophobia.  If the patient has a history of such, it may be prudent to perform a lumbar puncture.  Normal pituitary.  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  Flow voids are maintained in the central vessels. Unremarkable osseous structures.  No acute sinus disease.  Grossly negative orbits and mastoids.  IMPRESSION: No visible acute stroke is evident.  Motion degraded exam with probable artifactual prolonged T2 signal in the basilar cisterns although correlate clinically for signs and symptoms of subarachnoid hemorrhage.  See discussion above.  MRA HEAD  Findings: Motion degraded exam results in misregistration of vessels.  Study marginally diagnostic.  Gross patency of the carotid, vertebral, and basilar arteries is  established. Similarly, both middle cerebral arteries, both  anterior cerebral arteries, and posterior cerebral arteries display flow related enhancement indicating patency.  Presence or absence of flow limiting stenosis is difficult to exclude on this examination.  IMPRESSION: Motion degraded examination is marginally diagnostic.  Gross patency of the major intracranial vasculature is established.  See discussion above.   Original Report Authenticated By: Davonna Belling, M.D.    Dg Chest Portable 1 View  08/05/2012  *RADIOLOGY REPORT*  Clinical Data: Weakness, hemoptysis, former smoker, history CHF, asthma, COPD  PORTABLE CHEST - 1 VIEW  Comparison: Portable exam 1111 hours compared to 01/12/2009  Findings: Upper-normal size of cardiac silhouette. Mediastinal contours and pulmonary vascularity normal. Lungs clear. Tip of left lung apex excluded. No pleural effusion or gross evidence of pneumothorax. Bones demineralized.  IMPRESSION: No acute abnormalities.   Original Report Authenticated By: Ulyses Southward, M.D.     Review of Systems  Constitutional: Positive for malaise/fatigue.  Musculoskeletal: Positive for myalgias.  Neurological: Positive for weakness.   Blood pressure 148/72, pulse 59, temperature 98.1 F (36.7 C), temperature source Oral, resp. rate 18, height 5\' 4"  (1.626 m), weight 72.576 kg (160 lb), SpO2 100.00%. Physical Exam  Neck: No JVD present.  Respiratory: She has no wheezes.  GI: She exhibits no distension. There is no tenderness. There is no rebound.  Musculoskeletal: She exhibits no edema.    Assessment/Plan Problem #1 hypokalemia etiology as this moment is not clear. A patient with normotensive with slightly low CO2 of 20 to 21 and urine potassium of 9 the etiology seems to be more extrarenal such as diarrhea. However presently patient denies any GI problems. Presently there is no family history of hypokalemia. Presently her urine sodium or chloride his available. Problem #2  history of COPD Problem #3 history of GERD History of anxiety disorder. Problem #5 history of CHF no sign of fluid overload. Plan: We'll check urine sodium, urine chloride and urine potassium. We'll check also her ABG We'll change by mouth potassium to 40 mg by mouth twice a day We will start patient on normal saline with 20 mEq of KCl 100 cc per hour. We'll check her basic metabolic panel in the morning.    [place] Masaki S 08/06/2012, 8:13 PM

## 2012-08-06 NOTE — Progress Notes (Signed)
[facility]                                                                                Patient Demographics  [name], is a 47 y.o. female, DOB - 02-17-1966, ZOX:[id number], WJX:[id number]  Admit date - 08/05/2012  Admitting Physician Leroy Sea, MD  Outpatient Primary MD for the patient is No PCP Per Patient  LOS - 1   Chief Complaint  Patient presents with  . Weakness        Assessment & Plan    1. Generalized weakness worse in the left upper extremity. Likely due to severe hypokalemia and mild hypocalcemia, no focal deficits, no headache or photophobia, weakness is improving with potassium supplementation, CT of the brain unremarkable, MRI MRA noted with motion degraded artifact, baseline echogram obtained. LDL is 66, A1c is 5.5. PT to evaluate, continue to monitor her weakness which continues to improve with potassium supplementation.   2. Severe hypokalemia and mild hypocalcemia reason is unclear, she is on no medications, no history of diarrhea nausea vomiting, will check urinary sodium and potassium - CK levels were mildly elevated, generalized weakness and body aches due to hyperkalemia, continue aggressive IV and oral replacement, requested renal to see the patient.    3. History of asthma, anemia of chronic disease. No acute issues, as needed mobilize her treatments and oxygen. No requirement for transfusion at this time. Outpatient followup with PCP for anemia post discharge.    4. History of smoking quit 8 months ago, counseled to continue abstaining from smoking.     Code Status: Full  Family Communication: daughter  Disposition Plan: Home   Procedures Echo   Consults  Renal   DVT Prophylaxis  Lovenox   Lab Results  Component Value Date   PLT 383 08/06/2012    Medications  Scheduled Meds: . aspirin EC  325 mg Oral Daily  . calcium gluconate  1 g Intravenous Once  . enoxaparin (LOVENOX) injection  40 mg Subcutaneous Q24H   . potassium chloride  10 mEq Intravenous Q1 Hr x 6  . sodium chloride  3 mL Intravenous Q12H   Continuous Infusions: . sodium chloride 100 mL/hr at 08/06/12 0128   PRN Meds:.albuterol, guaiFENesin-dextromethorphan, HYDROcodone-acetaminophen, polyethylene glycol, promethazine  Antibiotics    Anti-infectives   None       Time Spent in minutes  35   Susa Raring K M.D on 08/06/2012 at 10:54 AM  Between 7am to 7pm - Pager - [phone]  After 7pm go to [email or web address] - password [email or web address]  And look for the night coverage person covering for me after hours  [facility] Office  [phone]    Subjective:   [name] today has, No headache, No chest pain, No abdominal pain - No Nausea, No new weakness tingling or numbness, No Cough - SOB. Improving weakness.  Objective:   Filed Vitals:   08/05/12 1241 08/05/12 1500 08/05/12 2052 08/06/12 0455  BP: 127/63 134/90 151/87 119/72  Pulse: 59 59 101 59  Temp:   98 F (36.7 C) 98.9 F (37.2 C)  TempSrc:   Axillary Oral  Resp: 18 18 18 18   Height:  Weight:      SpO2: 100% 100% 100% 99%    Wt Readings from Last 3 Encounters:  08/05/12 72.576 kg (160 lb)  12/05/10 60.782 kg (134 lb)  11/26/10 58.968 kg (130 lb)     Intake/Output Summary (Last 24 hours) at 08/06/12 1054 Last data filed at 08/06/12 0152  Gross per 24 hour  Intake    480 ml  Output      0 ml  Net    480 ml    Exam Awake Alert, Oriented X 3, No new F.N deficits, Normal affect, improving weakness and upper extremities [place].AT,PERRAL Supple Neck,No JVD, No cervical lymphadenopathy appriciated.  Symmetrical Chest wall movement, Good air movement bilaterally, CTAB RRR,No Gallops,Rubs or new Murmurs, No Parasternal Heave +ve B.Sounds, Abd Soft, Non tender, No organomegaly appriciated, No rebound - guarding or rigidity. No Cyanosis, Clubbing or edema, No new Rash or bruise      Data Review   Micro Results No results found for this  or any previous visit (from the past 240 hour(s)).  Radiology Reports Ct Head Wo Contrast  08/05/2012  *RADIOLOGY REPORT*  Clinical Data: Weakness of the left leg and arm  CT HEAD WITHOUT CONTRAST  Technique:  Contiguous axial images were obtained from the base of the skull through the vertex without contrast.  Comparison: None.  Findings: The brain does not show atrophy.  There is no evidence of old or acute infarction, mass lesion, hemorrhage, hydrocephalus or extra-axial collection.  The calvarium is unremarkable.  Sinuses, middle ears and mastoids are clear.  IMPRESSION: Normal head CT.   Original Report Authenticated By: Paulina Fusi, M.D.    Mr [facility] Wo Contrast  08/05/2012  *RADIOLOGY REPORT*  Clinical Data:  Generalized weakness with hypokinesia.  Congestive heart failure.  MRI HEAD WITHOUT CONTRAST MRA HEAD WITHOUT CONTRAST  Technique:  Multiplanar, multiecho pulse sequences of the brain and surrounding structures were obtained without intravenous contrast. Angiographic images of the head were obtained using MRA technique without contrast.  Comparison:  CT head earlier in the day.  MRI HEAD  Findings:  No acute stroke or mass lesion.  No hydrocephalus is present.  Normal cerebral volume. There may be early chronic microvascular ischemic change in the subcortical white matter but these scattered lesions are nonspecific.  There is considerable motion degradation on the FLAIR sequence. There is increased T2 signal in the cerebellopontine angles and prepontine cisterns.  I suspect that this is motion related, particularly given the normal CT appearance, but this finding can be present in  subarachnoid hemorrhage.  Correlate clinically for symptoms of sudden onset headache or photophobia.  If the patient has a history of such, it may be prudent to perform a lumbar puncture.  Normal pituitary.  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  Flow voids are maintained in the central vessels.  Unremarkable osseous structures.  No acute sinus disease.  Grossly negative orbits and mastoids.  IMPRESSION: No visible acute stroke is evident.  Motion degraded exam with probable artifactual prolonged T2 signal in the basilar cisterns although correlate clinically for signs and symptoms of subarachnoid hemorrhage.  See discussion above.  MRA HEAD  Findings: Motion degraded exam results in misregistration of vessels.  Study marginally diagnostic.  Gross patency of the carotid, vertebral, and basilar arteries is established. Similarly, both middle cerebral arteries, both anterior cerebral arteries, and posterior cerebral arteries display flow related enhancement indicating patency.  Presence or absence of flow limiting stenosis is difficult to exclude  on this examination.  IMPRESSION: Motion degraded examination is marginally diagnostic.  Gross patency of the major intracranial vasculature is established.  See discussion above.   Original Report Authenticated By: Davonna Belling, M.D.    Mr Brain Wo Contrast  08/05/2012  *RADIOLOGY REPORT*  Clinical Data:  Generalized weakness with hypokinesia.  Congestive heart failure.  MRI HEAD WITHOUT CONTRAST MRA HEAD WITHOUT CONTRAST  Technique:  Multiplanar, multiecho pulse sequences of the brain and surrounding structures were obtained without intravenous contrast. Angiographic images of the head were obtained using MRA technique without contrast.  Comparison:  CT head earlier in the day.  MRI HEAD  Findings:  No acute stroke or mass lesion.  No hydrocephalus is present.  Normal cerebral volume. There may be early chronic microvascular ischemic change in the subcortical white matter but these scattered lesions are nonspecific.  There is considerable motion degradation on the FLAIR sequence. There is increased T2 signal in the cerebellopontine angles and prepontine cisterns.  I suspect that this is motion related, particularly given the normal CT appearance, but this finding  can be present in  subarachnoid hemorrhage.  Correlate clinically for symptoms of sudden onset headache or photophobia.  If the patient has a history of such, it may be prudent to perform a lumbar puncture.  Normal pituitary.  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  Flow voids are maintained in the central vessels. Unremarkable osseous structures.  No acute sinus disease.  Grossly negative orbits and mastoids.  IMPRESSION: No visible acute stroke is evident.  Motion degraded exam with probable artifactual prolonged T2 signal in the basilar cisterns although correlate clinically for signs and symptoms of subarachnoid hemorrhage.  See discussion above.  MRA HEAD  Findings: Motion degraded exam results in misregistration of vessels.  Study marginally diagnostic.  Gross patency of the carotid, vertebral, and basilar arteries is established. Similarly, both middle cerebral arteries, both anterior cerebral arteries, and posterior cerebral arteries display flow related enhancement indicating patency.  Presence or absence of flow limiting stenosis is difficult to exclude on this examination.  IMPRESSION: Motion degraded examination is marginally diagnostic.  Gross patency of the major intracranial vasculature is established.  See discussion above.   Original Report Authenticated By: Davonna Belling, M.D.    Dg Chest Portable 1 View  08/05/2012  *RADIOLOGY REPORT*  Clinical Data: Weakness, hemoptysis, former smoker, history CHF, asthma, COPD  PORTABLE CHEST - 1 VIEW  Comparison: Portable exam 1111 hours compared to 01/12/2009  Findings: Upper-normal size of cardiac silhouette. Mediastinal contours and pulmonary vascularity normal. Lungs clear. Tip of left lung apex excluded. No pleural effusion or gross evidence of pneumothorax. Bones demineralized.  IMPRESSION: No acute abnormalities.   Original Report Authenticated By: Ulyses Southward, M.D.     CBC  Recent Labs Lab 08/05/12 1112 08/06/12 0218  WBC 8.8 5.9   HGB 9.5* 8.0*  HCT 29.7* 25.0*  PLT 514* 383  MCV 85.3 85.0  MCH 27.3 27.2  MCHC 32.0 32.0  RDW 14.9 15.2  LYMPHSABS 1.7  --   MONOABS 0.4  --   EOSABS 0.5  --   BASOSABS 0.0  --     Chemistries   Recent Labs Lab 08/05/12 1112 08/05/12 1132 08/05/12 1425 08/05/12 2223 08/06/12 0218 08/06/12 0524  NA 140  --  143  --  143  --   K <2.0*  --  <2.0* <2.0* <2.0* <2.0*  CL 103  --  110  --  108  --   CO2  21  --  20  --  22  --   GLUCOSE 127*  --  106*  --  117*  --   BUN 8  --  7  --  5*  --   CREATININE 0.75  --  0.72  --  0.71  --   CALCIUM 9.1  --  7.7*  --  7.8*  --   MG  --   --  1.7 1.9 1.9  --   AST  --  23  --   --   --   --   ALT  --  9  --   --   --   --   ALKPHOS  --  82  --   --   --   --   BILITOT  --  0.1*  --   --   --   --    ------------------------------------------------------------------------------------------------------------------ estimated creatinine clearance is 85 ml/min (by C-G formula based on Cr of 0.71). ------------------------------------------------------------------------------------------------------------------  Recent Labs  08/05/12 1425  HGBA1C 5.5   ------------------------------------------------------------------------------------------------------------------  Recent Labs  08/06/12 0524  CHOL 157  HDL 43  LDLCALC 66  TRIG 238*  CHOLHDL 3.7   ------------------------------------------------------------------------------------------------------------------ No results found for this basename: TSH, T4TOTAL, FREET3, T3FREE, THYROIDAB,  in the last 72 hours ------------------------------------------------------------------------------------------------------------------ No results found for this basename: VITAMINB12, FOLATE, FERRITIN, TIBC, IRON, RETICCTPCT,  in the last 72 hours  Coagulation profile No results found for this basename: INR, PROTIME,  in the last 168 hours  No results found for this basename: DDIMER,  in  the last 72 hours  Cardiac Enzymes  Recent Labs Lab 08/05/12 1425 08/05/12 2058 08/06/12 0218  TROPONINI <0.30 <0.30 <0.30   ------------------------------------------------------------------------------------------------------------------ No components found with this basename: POCBNP,

## 2012-08-07 LAB — BASIC METABOLIC PANEL
CO2: 23 mEq/L (ref 19–32)
Calcium: 8.5 mg/dL (ref 8.4–10.5)
Chloride: 110 mEq/L (ref 96–112)
Glucose, Bld: 99 mg/dL (ref 70–99)
Potassium: 3 mEq/L — ABNORMAL LOW (ref 3.5–5.1)
Sodium: 144 mEq/L (ref 135–145)

## 2012-08-07 LAB — POTASSIUM: Potassium: 2.7 mEq/L — CL (ref 3.5–5.1)

## 2012-08-07 LAB — NA AND K (SODIUM & POTASSIUM), RAND UR: Sodium, Ur: 123 mEq/L

## 2012-08-07 LAB — CHLORIDE, URINE, RANDOM: Chloride Urine: 130 mEq/L

## 2012-08-07 LAB — MAGNESIUM: Magnesium: 2.2 mg/dL (ref 1.5–2.5)

## 2012-08-07 MED ORDER — POTASSIUM CHLORIDE 10 MEQ/100ML IV SOLN
10.0000 meq | INTRAVENOUS | Status: AC
  Administered 2012-08-07 (×3): 10 meq via INTRAVENOUS
  Filled 2012-08-07 (×3): qty 100

## 2012-08-07 MED ORDER — POTASSIUM CHLORIDE CRYS ER 20 MEQ PO TBCR: 40.0000 meq | EXTENDED_RELEASE_TABLET | Freq: Two times a day (BID) | ORAL | Status: DC

## 2012-08-07 MED ORDER — ACETAMINOPHEN 325 MG PO TABS
650.0000 mg | ORAL_TABLET | Freq: Four times a day (QID) | ORAL | Status: DC | PRN
  Filled 2012-08-07 (×2): qty 2

## 2012-08-07 MED ORDER — POTASSIUM CHLORIDE 10 MEQ/100ML IV SOLN
INTRAVENOUS | Status: AC
  Administered 2012-08-07: 10 meq
  Filled 2012-08-07: qty 100

## 2012-08-07 MED ORDER — POTASSIUM CHLORIDE CRYS ER 20 MEQ PO TBCR
40.0000 meq | EXTENDED_RELEASE_TABLET | Freq: Three times a day (TID) | ORAL | Status: DC
  Administered 2012-08-07 (×2): 40 meq via ORAL
  Filled 2012-08-07 (×2): qty 2

## 2012-08-07 NOTE — Progress Notes (Signed)
Occupational Therapy Screen  OT orders received. Patient's chart reviewed. Patient is functioning at prior level of function and does not require OT services at this time; will sign off.   Limmie Patricia, OTR/L,CBIS  08/07/12 8:09AM

## 2012-08-07 NOTE — Progress Notes (Signed)
Pt is to be discharged home today. Pt is in NAD, IV is out, all paperwork has been reviewed/discussed with patient, and there are no questions/concerns at this time. Assessment is unchanged from this morning. Pt is to be accompanied downstairs by staff and family via wheelchair.  

## 2012-08-07 NOTE — Progress Notes (Signed)
[name]  MRN: [id number]  DOB/AGE: 47/14/1967 47 y.o.  Primary Care Physician:No PCP Per Patient  Admit date: 08/05/2012  Chief Complaint:  Chief Complaint  Patient presents with  . Weakness    S-Pt presented on  08/05/2012 with  Chief Complaint  Patient presents with  . Weakness  .    Pt today feels better  Meds . aspirin EC  325 mg Oral Daily  . enoxaparin (LOVENOX) injection  40 mg Subcutaneous Q24H  . potassium chloride SA  40 mEq Oral TID  . sodium chloride  3 mL Intravenous Q12H         Physical Exam: Vital signs in last 24 hours: Temp:  [97.8 F (36.6 C)-98.3 F (36.8 C)] 98.2 F (36.8 C) (05/14 1334) Pulse Rate:  [61-68] 68 (05/14 1334) Resp:  [16-17] 16 (05/14 1334) BP: (135-147)/(81-84) 135/84 mmHg (05/14 1334) SpO2:  [94 %-98 %] 98 % (05/14 1334) Weight change:  Last BM Date: 08/06/12  Intake/Output from previous day: 05/13 0701 - 05/14 0700 In: 1407.5 [P.O.:720; I.V.:687.5] Out: -  Total I/O In: 720 [P.O.:720] Out: -    Physical Exam: General- pt is awake,alert, oriented to time place and person Resp- No acute REsp distress, CTA B/L NO Rhonchi CVS- S1S2 regular ij rate and rhythm GIT- BS+, soft, NT, ND EXT- NO LE Edema, Cyanosis   Lab Results: CBC  Recent Labs  08/05/12 1112 08/06/12 0218  WBC 8.8 5.9  HGB 9.5* 8.0*  HCT 29.7* 25.0*  PLT 514* 383    BMET  Recent Labs  08/06/12 0218  08/06/12 0843  08/07/12 0517 08/07/12 1310  NA 143  --   --   --  144  --   K <2.0*  < >  --   < > 3.0* 3.2*  CL 108  --   --   --  110  --   CO2 22  --   --   --  23  --   GLUCOSE 117*  --   --   --  99  --   BUN 5*  --   --   --  4*  --   CREATININE 0.71  --   --   --  0.72  --   CALCIUM 7.8*  --  8.1*  --  8.5  --   < > = values in this interval not displayed.     Lab Results  Component Value Date   PTH 60.9 08/06/2012   CALCIUM 8.5 08/07/2012   CAION 1.14 08/06/2012                Impression: 1)Hypokalemia Etiology GI Vs Renal Pt Urine shows very low Potassium Most likely GI  2)HTN BP at goal Target Organ damage  LVH   3)Anemia HGb at goal. GI work up done on 2009   4)Hypocalcemia  will check albumin to correct for Hypoalbuminemia  5)Acid base Co2 at goal Was low earlier    Plan:  Agree with current tx and plan. Pt will need close follow up as outpt by Pcp/Nephrologist      Takeshi Teasdale S 08/07/2012, 4:25 PM

## 2012-08-07 NOTE — Progress Notes (Signed)
CRITICAL VALUE ALERT  Critical value received:  Potassium=2.7  Date of notification:  08/07/12  Time of notification:  0015  Critical value read back:yes  Nurse who received alert: Jinny Sanders, RN   MD notified (1st page): Dr. Phillips Odor  Time of first page:  0025  MD notified (2nd page):  Time of second page:  Responding MD:  Dr. Phillips Odor  Time MD responded:  [phone] No new orders given at this time.  Nursing staff to continue to monitor.

## 2012-08-07 NOTE — Discharge Summary (Signed)
[facility]                                                                                   [name], is a 47 y.o. female  DOB 1966-02-08  MRN [id number].  Admission date:  08/05/2012  Discharge Date:  08/07/2012  Primary MD  No PCP Per Patient  Admitting Physician  Leroy Sea, MD  Admission Diagnosis  Esophageal reflux [530.81] COPD (chronic obstructive pulmonary disease) [496] Hypokalemia [276.8] Arthritis [716.90] CHF (congestive heart failure) [428.0] General weakness [780.79] Left arm weakness [729.89]  Discharge Diagnosis     Principal Problem:   Hypokalemia Active Problems:   Chronic airway obstruction, not elsewhere classified   GERD   ANEMIA, HX OF   General weakness   Hypocalcemia    Past Medical History  Diagnosis Date  . COPD (chronic obstructive pulmonary disease)   . Asthma   . Anemia   . Arthritis   . CHF (congestive heart failure)     Past Surgical History  Procedure Laterality Date  . Abdominal hysterectomy    . Cholecystectomy    . Hip surgery    . Knee surgery    . Ectopic pregnancy surgery    . Esophagogastroduodenoscopy  07/16/2007    NFA:OZHYQM esophagus, small hiatal  hernia, and submucosal petechial hemorrhages secondary to heaving  . Colonoscopy  07/16/2007    RMR:  Normal rectum, colon, and terminal ileum.  Poor  . Hernia repair       Recommendations for primary care physician for things to follow:   Follow BMP and KDUR dose closely   Discharge Diagnoses:   Principal Problem:   Hypokalemia Active Problems:   Chronic airway obstruction, not elsewhere classified   GERD   ANEMIA, HX OF   General weakness   Hypocalcemia    Discharge Condition: Stable   Diet recommendation: See Discharge Instructions below   Consults renal    History of present illness and  Hospital Course:     Kindly see H&P for history of present illness and admission details, please review complete Labs, Consult  reports and Test reports for all details in brief MARCA GADSBY, is a 47 y.o. female, patient with history of anemia of chronic disease, COPD was admitted to the hospital with generalized weakness mostly in the left upper arm, muscle aches or due to severe hypokalemia arising from unclear etiology. Patient denied any diuretic use, denied any nausea vomiting or diarrhea which was asked several times during history taking. She required several IV potassium runs and had a potassium of 1.3 upon admission, with aggressive supplementation IV and oral potassium levels have considerably improved and she is now symptom-free and weakness.  She will get another potassium checked later today, once the levels showed continued improvement she will be discharged on oral potassium supplementation, case management has been consulted to provide patient with the primary care physician for close followup, she has also been requested to follow with renal in 2 days to get another BMP checked. She will be placed on oral potassium supplementation dose of which should be closely monitored based on her potassium levels.  Today   Subjective:   Tomara Youngberg today has no headache,no chest abdominal pain,no new weakness tingling or numbness, feels much better wants to go home today.    Objective:   Blood pressure 135/84, pulse 68, temperature 98.2 F (36.8 C), temperature source Oral, resp. rate 16, height 5\' 4"  (1.626 m), weight 72.576 kg (160 lb), SpO2 98.00%.   Intake/Output Summary (Last 24 hours) at 08/07/12 1411 Last data filed at 08/07/12 0800  Gross per 24 hour  Intake 1407.5 ml  Output      0 ml  Net 1407.5 ml    Exam Awake Alert, Oriented *3, No new F.N deficits, Normal affect [place].AT,PERRAL Supple Neck,No JVD, No cervical lymphadenopathy appriciated.  Symmetrical Chest wall movement, Good air movement bilaterally, CTAB RRR,No Gallops,Rubs or new Murmurs, No Parasternal Heave +ve B.Sounds, Abd  Soft, Non tender, No organomegaly appriciated, No rebound -guarding or rigidity. No Cyanosis, Clubbing or edema, No new Rash or bruise  Data Review   Major procedures and Radiology Reports - PLEASE review detailed and final reports for all details in brief -    ECHO  Left ventricle: The cavity size was normal. Wall thickness was increased in a pattern of mild LVH. Systolic function was normal. The estimated ejection fraction was in the range of 55% to 60%. Wall motion was normal; there were no regional wall motion abnormalities. Left ventricular diastolic function parameters were normal     Ct Head Wo Contrast  08/05/2012   *RADIOLOGY REPORT*  Clinical Data: Weakness of the left leg and arm  CT HEAD WITHOUT CONTRAST  Technique:  Contiguous axial images were obtained from the base of the skull through the vertex without contrast.  Comparison: None.  Findings: The brain does not show atrophy.  There is no evidence of old or acute infarction, mass lesion, hemorrhage, hydrocephalus or extra-axial collection.  The calvarium is unremarkable.  Sinuses, middle ears and mastoids are clear.  IMPRESSION: Normal head CT.   Original Report Authenticated By: Paulina Fusi, M.D.   Mr [facility] Wo Contrast  08/05/2012   *RADIOLOGY REPORT*  Clinical Data:  Generalized weakness with hypokinesia.  Congestive heart failure.  MRI HEAD WITHOUT CONTRAST MRA HEAD WITHOUT CONTRAST  Technique:  Multiplanar, multiecho pulse sequences of the brain and surrounding structures were obtained without intravenous contrast. Angiographic images of the head were obtained using MRA technique without contrast.  Comparison:  CT head earlier in the day.  MRI HEAD  Findings:  No acute stroke or mass lesion.  No hydrocephalus is present.  Normal cerebral volume. There may be early chronic microvascular ischemic change in the subcortical white matter but these scattered lesions are nonspecific.  There is considerable motion degradation on  the FLAIR sequence. There is increased T2 signal in the cerebellopontine angles and prepontine cisterns.  I suspect that this is motion related, particularly given the normal CT appearance, but this finding can be present in  subarachnoid hemorrhage.  Correlate clinically for symptoms of sudden onset headache or photophobia.  If the patient has a history of such, it may be prudent to perform a lumbar puncture.  Normal pituitary.  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  Flow voids are maintained in the central vessels. Unremarkable osseous structures.  No acute sinus disease.  Grossly negative orbits and mastoids.  IMPRESSION: No visible acute stroke is evident.  Motion degraded exam with probable artifactual prolonged T2 signal in the basilar cisterns although correlate clinically for signs and symptoms of subarachnoid  hemorrhage.  See discussion above.  MRA HEAD  Findings: Motion degraded exam results in misregistration of vessels.  Study marginally diagnostic.  Gross patency of the carotid, vertebral, and basilar arteries is established. Similarly, both middle cerebral arteries, both anterior cerebral arteries, and posterior cerebral arteries display flow related enhancement indicating patency.  Presence or absence of flow limiting stenosis is difficult to exclude on this examination.  IMPRESSION: Motion degraded examination is marginally diagnostic.  Gross patency of the major intracranial vasculature is established.  See discussion above.   Original Report Authenticated By: Davonna Belling, M.D.   Mr Brain Wo Contrast  08/05/2012   *RADIOLOGY REPORT*  Clinical Data:  Generalized weakness with hypokinesia.  Congestive heart failure.  MRI HEAD WITHOUT CONTRAST MRA HEAD WITHOUT CONTRAST  Technique:  Multiplanar, multiecho pulse sequences of the brain and surrounding structures were obtained without intravenous contrast. Angiographic images of the head were obtained using MRA technique without contrast.   Comparison:  CT head earlier in the day.  MRI HEAD  Findings:  No acute stroke or mass lesion.  No hydrocephalus is present.  Normal cerebral volume. There may be early chronic microvascular ischemic change in the subcortical white matter but these scattered lesions are nonspecific.  There is considerable motion degradation on the FLAIR sequence. There is increased T2 signal in the cerebellopontine angles and prepontine cisterns.  I suspect that this is motion related, particularly given the normal CT appearance, but this finding can be present in  subarachnoid hemorrhage.  Correlate clinically for symptoms of sudden onset headache or photophobia.  If the patient has a history of such, it may be prudent to perform a lumbar puncture.  Normal pituitary.  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  Flow voids are maintained in the central vessels. Unremarkable osseous structures.  No acute sinus disease.  Grossly negative orbits and mastoids.  IMPRESSION: No visible acute stroke is evident.  Motion degraded exam with probable artifactual prolonged T2 signal in the basilar cisterns although correlate clinically for signs and symptoms of subarachnoid hemorrhage.  See discussion above.  MRA HEAD  Findings: Motion degraded exam results in misregistration of vessels.  Study marginally diagnostic.  Gross patency of the carotid, vertebral, and basilar arteries is established. Similarly, both middle cerebral arteries, both anterior cerebral arteries, and posterior cerebral arteries display flow related enhancement indicating patency.  Presence or absence of flow limiting stenosis is difficult to exclude on this examination.  IMPRESSION: Motion degraded examination is marginally diagnostic.  Gross patency of the major intracranial vasculature is established.  See discussion above.   Original Report Authenticated By: Davonna Belling, M.D.   Dg Chest Portable 1 View  08/05/2012   *RADIOLOGY REPORT*  Clinical Data:  Weakness, hemoptysis, former smoker, history CHF, asthma, COPD  PORTABLE CHEST - 1 VIEW  Comparison: Portable exam 1111 hours compared to 01/12/2009  Findings: Upper-normal size of cardiac silhouette. Mediastinal contours and pulmonary vascularity normal. Lungs clear. Tip of left lung apex excluded. No pleural effusion or gross evidence of pneumothorax. Bones demineralized.  IMPRESSION: No acute abnormalities.   Original Report Authenticated By: Ulyses Southward, M.D.    Micro Results      No results found for this or any previous visit (from the past 240 hour(s)).   CBC w Diff: Lab Results  Component Value Date   WBC 5.9 08/06/2012   HGB 8.0* 08/06/2012   HCT 25.0* 08/06/2012   PLT 383 08/06/2012   LYMPHOPCT 19 08/05/2012   MONOPCT 4 08/05/2012  EOSPCT 5 08/05/2012   BASOPCT 0 08/05/2012    CMP: Lab Results  Component Value Date   NA 144 08/07/2012   K 3.2* 08/07/2012   CL 110 08/07/2012   CO2 23 08/07/2012   BUN 4* 08/07/2012   CREATININE 0.72 08/07/2012   PROT 7.2 08/05/2012   ALBUMIN 3.5 08/05/2012   BILITOT 0.1* 08/05/2012   ALKPHOS 82 08/05/2012   AST 23 08/05/2012   ALT 9 08/05/2012  .   Discharge Instructions     Follow with Primary MD or the recommended kidney doctor in 2 days   Get CBC, CMP, checked 2 days by Primary MD and again as instructed by your Primary MD.    Get Medicines reviewed and adjusted.  Please request your Prim.MD to go over all Hospital Tests and Procedure/Radiological results at the follow up, please get all Hospital records sent to your Prim MD by signing hospital release before you go home.  Activity: As tolerated with Full fall precautions use walker/cane & assistance as needed   Diet: Heart Healthy  For Heart failure patients - Check your Weight same time everyday, if you gain over 2 pounds, or you develop in leg swelling, experience more shortness of breath or chest pain, call your Primary MD immediately. Follow Cardiac Low Salt Diet and 1.8 lit/day  fluid restriction.  Disposition Home   If you experience worsening of your admission symptoms, develop shortness of breath, life threatening emergency, suicidal or homicidal thoughts you must seek medical attention immediately by calling 911 or calling your MD immediately  if symptoms less severe.  You Must read complete instructions/literature along with all the possible adverse reactions/side effects for all the Medicines you take and that have been prescribed to you. Take any new Medicines after you have completely understood and accpet all the possible adverse reactions/side effects.   Do not drive and provide baby sitting services if your were admitted for syncope or siezures until you have seen by Primary MD or a Neurologist and advised to do so again.  Do not drive when taking Pain medications.    Do not take more than prescribed Pain, Sleep and Anxiety Medications  Special Instructions: If you have smoked or chewed Tobacco  in the last 2 yrs please stop smoking, stop any regular Alcohol  and or any Recreational drug use.  Wear Seat belts while driving.       Follow-up Information   Call [facility] S, MD. (Office will call to schedule appointment)    Contact information:   [address] [place] [address] [phone]       Follow up with PCP as suggested by case manager. Schedule an appointment as soon as possible for a visit in 2 days. (get BMP checked)         Discharge Medications     Medication List    TAKE these medications       ibuprofen 200 MG tablet  Commonly known as:  ADVIL,MOTRIN  Take 600 mg by mouth every 6 (six) hours as needed for pain.     potassium chloride SA 20 MEQ tablet  Commonly known as:  K-DUR,KLOR-CON  Take 2 tablets (40 mEq total) by mouth 2 (two) times daily.     VENTOLIN HFA 108 (90 BASE) MCG/ACT inhaler  Generic drug:  albuterol  Inhale 2 puffs into the lungs daily as needed. Depending on activity for COPD            Total Time  in preparing paper work, data evaluation and todays exam - 35 minutes  Leroy Sea M.D on 08/07/2012 at 2:11 PM  [facility] Office  [phone]

## 2012-08-26 LAB — PTH-RELATED PEPTIDE: PTH-related peptide: <0.74

## 2013-02-13 ENCOUNTER — Other Ambulatory Visit (HOSPITAL_COMMUNITY): Payer: Self-pay | Admitting: Internal Medicine

## 2013-02-13 ENCOUNTER — Ambulatory Visit (HOSPITAL_COMMUNITY)
Admission: RE | Admit: 2013-02-13 | Discharge: 2013-02-13 | Disposition: A | Discharge status: Home / Self Care | Payer: 59 | Source: Ambulatory Visit | Attending: Internal Medicine | Admitting: Internal Medicine

## 2013-02-13 DIAGNOSIS — M25562 Pain in left knee: Secondary | ICD-10-CM

## 2013-02-13 DIAGNOSIS — M25569 Pain in unspecified knee: Secondary | ICD-10-CM | POA: Insufficient documentation

## 2020-05-29 ENCOUNTER — Emergency Department (HOSPITAL_COMMUNITY): Payer: Commercial Managed Care - PPO

## 2020-05-29 ENCOUNTER — Encounter (HOSPITAL_COMMUNITY): Payer: Self-pay | Admitting: Emergency Medicine

## 2020-05-29 ENCOUNTER — Emergency Department (HOSPITAL_COMMUNITY)
Admission: EM | Admit: 2020-05-29 | Discharge: 2020-05-29 | Disposition: A | Discharge status: Home / Self Care | Payer: Commercial Managed Care - PPO | Attending: Emergency Medicine | Admitting: Emergency Medicine

## 2020-05-29 ENCOUNTER — Other Ambulatory Visit: Payer: Self-pay

## 2020-05-29 DIAGNOSIS — S99912A Unspecified injury of left ankle, initial encounter: Secondary | ICD-10-CM | POA: Diagnosis present

## 2020-05-29 DIAGNOSIS — I509 Heart failure, unspecified: Secondary | ICD-10-CM | POA: Diagnosis not present

## 2020-05-29 DIAGNOSIS — J45909 Unspecified asthma, uncomplicated: Secondary | ICD-10-CM | POA: Insufficient documentation

## 2020-05-29 DIAGNOSIS — J449 Chronic obstructive pulmonary disease, unspecified: Secondary | ICD-10-CM | POA: Insufficient documentation

## 2020-05-29 DIAGNOSIS — R519 Headache, unspecified: Secondary | ICD-10-CM | POA: Diagnosis not present

## 2020-05-29 DIAGNOSIS — S8262XA Displaced fracture of lateral malleolus of left fibula, initial encounter for closed fracture: Secondary | ICD-10-CM | POA: Diagnosis not present

## 2020-05-29 DIAGNOSIS — X500XXA Overexertion from strenuous movement or load, initial encounter: Secondary | ICD-10-CM | POA: Insufficient documentation

## 2020-05-29 DIAGNOSIS — Z87891 Personal history of nicotine dependence: Secondary | ICD-10-CM | POA: Insufficient documentation

## 2020-05-29 MED ORDER — OXYCODONE-ACETAMINOPHEN 5-325 MG PO TABS
1.0000 | ORAL_TABLET | Freq: Once | ORAL | Status: AC
  Administered 2020-05-29: 1 via ORAL
  Filled 2020-05-29: qty 1

## 2020-05-29 MED ORDER — IBUPROFEN 800 MG PO TABS
800.0000 mg | ORAL_TABLET | Freq: Once | ORAL | Status: AC
  Administered 2020-05-29: 800 mg via ORAL
  Filled 2020-05-29: qty 1

## 2020-05-29 NOTE — ED Triage Notes (Signed)
Pt presents to ED with complaints of left foot pain. Pt states she went to stand up around 1200, her leg from the knee down felt numb "like dead weight", she tried to step and twisted her foot. Pt states the numbness comes and goes now.

## 2020-05-29 NOTE — Discharge Instructions (Addendum)
Keep ankle elevated with cold therapy.  Call ortho Monday for follow up Do not put weight on ankle. Use crutches Return if you are worse at any time.

## 2020-05-29 NOTE — ED Provider Notes (Signed)
[facility] EMERGENCY DEPARTMENT Provider Note   CSN: [id number] Arrival date & time: 05/29/20  0263     History Chief Complaint  Patient presents with  . Foot Pain    [name] is a 55 y.o. female.  HPI 55 year old female presents today complaining of left ankle pain. She states that yesterday she had been sitting for a while waiting for her mother a doctor's appointment when she went to get up. She felt that her left foot was normal pulled backwards. She had severe pain left foot and ankle. She states this has continued and is painful with any pressure on the left foot. She is able to move her toes and her ankle but has pain. She denies any weakness, numbness, tingling anywhere else. She has not had similar symptoms in the past. She is a smoker. She denies any history of peripheral vascular disease. She denies any history of stroke.    Past Medical History:  Diagnosis Date  . Anemia   . Arthritis   . Asthma   . CHF (congestive heart failure) (HCC)   . COPD (chronic obstructive pulmonary disease) [facility])     Patient Active Problem List   Diagnosis Date Noted  . Hypocalcemia 08/06/2012  . General weakness 08/05/2012  . Hypokalemia 08/05/2012  . CHF (congestive heart failure) (HCC)   . Arthritis   . COPD (chronic obstructive pulmonary disease) (HCC)   . ANXIETY DEPRESSION 01/06/2009  . GERD 01/06/2009  . ANOREXIA 01/06/2009  . NAUSEA 01/06/2009  . DIARRHEA, CHRONIC 01/06/2009  . ABDOMINAL PAIN 01/06/2009  . ANEMIA, HX OF 01/06/2009  . DRUG ABUSE, HX OF 01/06/2009  . Chronic airway obstruction, not elsewhere classified 08/16/2007    Past Surgical History:  Procedure Laterality Date  . ABDOMINAL HYSTERECTOMY    . CHOLECYSTECTOMY    . COLONOSCOPY  07/16/2007   RMR:  Normal rectum, colon, and terminal ileum.  Poor  . ECTOPIC PREGNANCY SURGERY    . ESOPHAGOGASTRODUODENOSCOPY  07/16/2007   ZCH:YIFOYD esophagus, small hiatal  hernia, and submucosal petechial  hemorrhages secondary to heaving  . HERNIA REPAIR    . HIP SURGERY    . KNEE SURGERY       OB History   No obstetric history on file.     History reviewed. No pertinent family history.  Social History   Tobacco Use  . Smoking status: Former Smoker    Packs/day: 1.00    Quit date: 11/26/2011    Years since quitting: 8.5  . Smokeless tobacco: Current User    Types: Chew  Substance Use Topics  . Alcohol use: No  . Drug use: No    Home Medications Prior to Admission medications   Medication Sig Start Date End Date Taking? Authorizing Provider  naproxen sodium (ALEVE) 220 MG tablet Take 220 mg by mouth 2 (two) times daily as needed (pain).   Yes [provider]    Allergies    Codeine, Penicillins, and Zofran [ondansetron hcl]  Review of Systems   Review of Systems  All other systems reviewed and are negative.   Physical Exam Updated Vital Signs BP 120/82   Pulse 68   Temp 98.8 F (37.1 C) (Oral)   Resp 18   Ht 1.626 m (5\' 4" )   Wt 54.4 kg   SpO2 97%   BMI 20.60 kg/m   Physical Exam Vitals and nursing note reviewed.  Constitutional:      Appearance: Normal appearance.  HENT:  Right Ear: External ear normal.     Left Ear: External ear normal.     Nose: Nose normal.     Mouth/Throat:     Pharynx: Oropharynx is clear.  Eyes:     Extraocular Movements: Extraocular movements intact.     Pupils: Pupils are equal, round, and reactive to light.  Cardiovascular:     Rate and Rhythm: Normal rate and regular rhythm.     Pulses: Normal pulses.     Heart sounds: Normal heart sounds.     Comments: Left foot with normal pulses foot and posterior tibialis and popliteal Pulmonary:     Effort: Pulmonary effort is normal.     Breath sounds: Normal breath sounds.  Abdominal:     General: Abdomen is flat.     Palpations: Abdomen is soft.  Musculoskeletal:        General: Normal range of motion.     Cervical back: Normal range of motion.     Comments:  Patient with swelling and diffuse pain in left ankle. Swelling is more prominent on the lateral side and is very tender. Skin is intact. Toes are pink with capillary refill less than 2 seconds Sensation is intact Patient is able to move her toes, ankle, knee.  Skin:    General: Skin is warm and dry.     Capillary Refill: Capillary refill takes less than 2 seconds.  Neurological:     General: No focal deficit present.     Mental Status: She is alert.     Cranial Nerves: No cranial nerve deficit.     Sensory: No sensory deficit.     Motor: No weakness.     Coordination: Coordination normal.     Deep Tendon Reflexes: Reflexes normal.  Psychiatric:        Mood and Affect: Mood normal.     ED Results / Procedures / Treatments   Labs (all labs ordered are listed, but only abnormal results are displayed) Labs Reviewed - No data to display  EKG None  Radiology DG Ankle Complete Left  Result Date: 05/29/2020 CLINICAL DATA:  Left foot pain EXAM: LEFT FOOT - COMPLETE 3+ VIEW; LEFT ANKLE COMPLETE - 3+ VIEW COMPARISON:  None. FINDINGS: Nondisplaced lateral malleolus fracture with prominent overlying soft tissue swelling. Normal alignment of the ankle without weight-bearing. Probable ankle joint effusion IMPRESSION: Nondisplaced lateral malleolus fracture. Electronically Signed   By: Marnee Spring M.D.   On: 05/29/2020 09:48   CT Head Wo Contrast  Result Date: 05/29/2020 CLINICAL DATA:  55 year old female with acute LEFT leg numbness. EXAM: CT HEAD WITHOUT CONTRAST TECHNIQUE: Contiguous axial images were obtained from the base of the skull through the vertex without intravenous contrast. COMPARISON:  08/05/2012 head CT FINDINGS: Brain: No evidence of acute infarction, hemorrhage, hydrocephalus, extra-axial collection or mass lesion/mass effect. Vascular: No hyperdense vessel or unexpected calcification. Skull: Normal. Negative for fracture or focal lesion. Sinuses/Orbits: No acute finding. Other:  None. IMPRESSION: Unremarkable noncontrast head CT. Electronically Signed   By: Harmon Pier M.D.   On: 05/29/2020 09:44   DG Foot Complete Left  Result Date: 05/29/2020 CLINICAL DATA:  Left foot pain EXAM: LEFT FOOT - COMPLETE 3+ VIEW; LEFT ANKLE COMPLETE - 3+ VIEW COMPARISON:  None. FINDINGS: Nondisplaced lateral malleolus fracture with prominent overlying soft tissue swelling. Normal alignment of the ankle without weight-bearing. Probable ankle joint effusion IMPRESSION: Nondisplaced lateral malleolus fracture. Electronically Signed   By: Marnee Spring M.D.   On: 05/29/2020 09:48  Procedures Procedures   Medications Ordered in ED Medications  ibuprofen (ADVIL) tablet 800 mg (has no administration in time range)  oxyCODONE-acetaminophen (PERCOCET/ROXICET) 5-325 MG per tablet 1 tablet (has no administration in time range)    ED Course  I have reviewed the triage vital signs and the nursing notes.  Pertinent labs & imaging results that were available during my care of the patient were reviewed by me and considered in my medical decision making (see chart for details).    MDM Rules/Calculators/A&P                         Patient with acute onset of left ankle pain after twisting ankle yesterday Head CT obtained due to patient's statement that her foot was numb at the time. She had focal extremity numbness, suspect was due to her prolonged sitting. Head CT is normal. Remainder of her neurological exam is normal and I have low index of suspicion for stroke. Patient does have a lateral malleolus fracture. Plan posterior splint, crutches, conservative therapies for pain management referral to orthopedist. Final Clinical Impression(s) / ED Diagnoses Final diagnoses:  Closed fracture of distal lateral malleolus of left fibula, initial encounter   Discussed with patient and advised re management and follow up Rx / DC Orders ED Discharge Orders    None       Margarita Grizzle, MD 05/29/20  1000

## 2020-05-31 ENCOUNTER — Encounter: Payer: Self-pay | Admitting: Orthopedic Surgery

## 2020-06-04 ENCOUNTER — Ambulatory Visit (INDEPENDENT_AMBULATORY_CARE_PROVIDER_SITE_OTHER): Payer: Commercial Managed Care - PPO | Admitting: Orthopedic Surgery

## 2020-06-04 ENCOUNTER — Ambulatory Visit: Payer: Commercial Managed Care - PPO

## 2020-06-04 ENCOUNTER — Other Ambulatory Visit: Payer: Self-pay

## 2020-06-04 ENCOUNTER — Encounter: Payer: Self-pay | Admitting: Orthopedic Surgery

## 2020-06-04 DIAGNOSIS — S82832A Other fracture of upper and lower end of left fibula, initial encounter for closed fracture: Secondary | ICD-10-CM

## 2020-06-04 DIAGNOSIS — M79672 Pain in left foot: Secondary | ICD-10-CM

## 2020-06-04 DIAGNOSIS — M25572 Pain in left ankle and joints of left foot: Secondary | ICD-10-CM

## 2020-06-04 MED ORDER — HYDROCODONE-ACETAMINOPHEN 5-325 MG PO TABS: 1.0000 | ORAL_TABLET | Freq: Four times a day (QID) | ORAL | 0 refills | Status: DC | PRN

## 2020-06-04 NOTE — Progress Notes (Signed)
New Patient Visit  Assessment: [name] is a 55 y.o. female with the following: Nondisplaced left fibula fracture  Plan: Repeated radiographs in clinic today which do not demonstrate additional injury.  It is conceivable that she has a nondisplaced left distal fibula fracture, although the amount of swelling and ecchymosis she has over her ankle and her foot is not necessarily consistent with the x-rays.  Nonetheless, she is obviously in discomfort, and once again due to the amount of swelling and bruising in the left ankle, this could be a significant ankle sprain.  Based on the radiographs, no concern for a Lisfranc injury.  There does not appear to be any dislocations about the ankle or the midfoot.  As a result, we will place her in a short leg cast, and I stressed to her the need for constant elevation to improve the swelling.  I have also provided her with a limited prescription of hydrocodone, to assist with severe pain that she may be having.  This will be the only prescription I will provide her for this injury.  She stated her understanding.  We will see her back in 2 weeks for repeat evaluation, at which time we will remove the cast and assess the swelling in her foot and ankle.  Follow-up: Return in about 2 weeks (around 06/18/2020).  Subjective:  Chief Complaint  Patient presents with  . Foot Pain    L/ was sitting for awhile waiting on her mom at doctor office and when she went to get up her entire leg was numb. She went to take a step and her foot folded on her.    History of Present Illness: [name] is a 55 y.o. female who presents for evaluation of left ankle and foot pain.  According the patient, she was seated at a doctor's office approximately 1 week ago, sitting on her left foot for an extended period of time.  Her left leg was subsequently numb, and when she got up to try and ambulate, she had little control over her ankle.  This caused repeated twisting  of the left ankle.  Due to the pain, she presented for evaluation the following day, and was noted to have a nondisplaced left fibula fracture.  She was placed in a posterior slab short leg splint, and has remained nonweightbearing.  She continues to have significant pain in her foot.  She states that she has been elevating her foot almost constantly.  She has been taking some pain medications provided to her by the emergency department.   Review of Systems: No fevers or chills No numbness or tingling No chest pain No shortness of breath No bowel or bladder dysfunction No GI distress No headaches   Medical History:  Past Medical History:  Diagnosis Date  . Anemia   . Arthritis   . Asthma   . CHF (congestive heart failure) (HCC)   . COPD (chronic obstructive pulmonary disease) (HCC)     Past Surgical History:  Procedure Laterality Date  . ABDOMINAL HYSTERECTOMY    . CHOLECYSTECTOMY    . COLONOSCOPY  07/16/2007   RMR:  Normal rectum, colon, and terminal ileum.  Poor  . ECTOPIC PREGNANCY SURGERY    . ESOPHAGOGASTRODUODENOSCOPY  07/16/2007   ZOX:[id number] esophagus, small hiatal  hernia, and submucosal petechial hemorrhages secondary to heaving  . HERNIA REPAIR    . HIP SURGERY    . KNEE SURGERY      History reviewed. No pertinent family  history. Social History   Tobacco Use  . Smoking status: Former Smoker    Packs/day: 1.00    Quit date: 11/26/2011    Years since quitting: 8.5  . Smokeless tobacco: Current User    Types: Chew  Substance Use Topics  . Alcohol use: No  . Drug use: No    Allergies  Allergen Reactions  . Codeine     REACTION: hives/itch  . Penicillins Other (See Comments)    UNKNOWN CHILDHOOD ALLERGY  . Zofran [Ondansetron Hcl] Itching and Rash    Current Meds  Medication Sig  . HYDROcodone-acetaminophen (NORCO/VICODIN) 5-325 MG tablet Take 1 tablet by mouth every 6 (six) hours as needed for moderate pain.    Objective: There were no vitals  taken for this visit.  Physical Exam:  General: Alert and oriented.  In some obvious discomfort. Gait: Unable to ambulate because of her left foot and ankle injury  Evaluation of the left foot after removal of the splint demonstrates significant swelling and ecchymosis from the level of her ankle distal.  The dorsum of the foot is very swollen.  In addition, she has some dependent ecchymosis over the hindfoot.  Significant ecchymosis to the fourth toe.  This is not tender to palpation.  Active dorsiflexion of the ankle and the great toe is appreciated.  Diffusely tender over both the medial and lateral ankles, and into her midfoot.  Sensation is intact over the dorsum of the foot, although it is somewhat decreased.  Toes are warm and well-perfused.   IMAGING: I personally ordered and reviewed the following images   X-rays of the left ankle and the left foot were obtained in clinic today and demonstrates a nondisplaced distal fibula fracture, without obvious displacement or interval callus formation.  The Lisfranc joint remains intact without obvious displacement.  There are no fractures to any of her toes.  On the lateral of the ankle, there is potentially some small avulsion fractures off the anterior aspect of the tibiotalar joint.  Impression: Nondisplaced left fibula fracture with diffuse swelling of the ankle and the foot.   New Medications:  Meds ordered this encounter  Medications  . HYDROcodone-acetaminophen (NORCO/VICODIN) 5-325 MG tablet    Sig: Take 1 tablet by mouth every 6 (six) hours as needed for moderate pain.    Dispense:  15 tablet    Refill:  0      Oliver Barre, MD  06/04/2020 12:17 PM

## 2020-06-18 ENCOUNTER — Ambulatory Visit: Payer: Commercial Managed Care - PPO | Admitting: Orthopedic Surgery

## 2020-06-22 ENCOUNTER — Ambulatory Visit: Payer: Commercial Managed Care - PPO

## 2020-06-22 ENCOUNTER — Ambulatory Visit (INDEPENDENT_AMBULATORY_CARE_PROVIDER_SITE_OTHER): Payer: Commercial Managed Care - PPO | Admitting: Orthopedic Surgery

## 2020-06-22 ENCOUNTER — Encounter: Payer: Self-pay | Admitting: Orthopedic Surgery

## 2020-06-22 ENCOUNTER — Other Ambulatory Visit: Payer: Self-pay

## 2020-06-22 VITALS — BP 106/55 | HR 74

## 2020-06-22 DIAGNOSIS — S82832D Other fracture of upper and lower end of left fibula, subsequent encounter for closed fracture with routine healing: Secondary | ICD-10-CM

## 2020-06-22 DIAGNOSIS — M79672 Pain in left foot: Secondary | ICD-10-CM

## 2020-06-22 NOTE — Patient Instructions (Signed)
Instructions  1.  You have sustained an ankle sprain/fracture that can be treated without surgery  2.  I encourage you to stay on your feet and gradually remove your walking boot, as tolerated 3.  Below are some exercises that you can complete on your own to improve your symptoms.  4.  As an alternative, you can search for ankle sprain exercises online, and can see some demonstrations on YouTube  5.  If you are having difficulty with these exercises, we can also prescribe formal physical therapy  Ankle Exercises Ask your health care provider which exercises are safe for you. Do exercises exactly as told by your health care provider and adjust them as directed. It is normal to feel mild stretching, pulling, tightness, or mild discomfort as you do these exercises. Stop right away if you feel sudden pain or your pain gets worse. Do not begin these exercises until told by your health care provider.  Stretching and range-of-motion exercises These exercises warm up your muscles and joints and improve the movement and flexibility of your ankle. These exercises may also help to relieve pain.  Dorsiflexion/plantar flexion  1. Sit with your L knee straight or bent. Do not rest your foot on anything. 2. Flex your left ankle to tilt the top of your foot toward your shin. This is called dorsiflexion. 3. Hold this position for 5 seconds. 4. Point your toes downward to tilt the top of your foot away from your shin. This is called plantar flexion. 5. Hold this position for 5 seconds. Repeat 10 times. Complete this exercise 2-3 times a day.  As tolerated  Ankle alphabet  1. Sit with your L foot supported at your lower leg. ? Do not rest your foot on anything. ? Make sure your foot has room to move freely. 2. Think of your L foot as a paintbrush: ? Move your foot to trace each letter of the alphabet in the air. Keep your hip and knee still while you trace the letters. Trace every letter from A to  Z. ? Make the letters as large as you can without causing or increasing any discomfort.  Repeat 2-3 times. Complete this exercise 2-3 times a day.   Strengthening exercises These exercises build strength and endurance in your ankle. Endurance is the ability to use your muscles for a long time, even after they get tired. Dorsiflexors These are muscles that lift your foot up. 1. Secure a rubber exercise band or tube to an object, such as a table leg, that will stay still when the band is pulled. Secure the other end around your L foot. 2. Sit on the floor, facing the object with your L leg extended. The band or tube should be slightly tense when your foot is relaxed. 3. Slowly flex your L ankle and toes to bring your foot toward your shin. 4. Hold this position for 5 seconds. 5. Slowly return your foot to the starting position, controlling the band as you do that. Repeat 10 times. Complete this exercise 2-3 times a day.  Plantar flexors These are muscles that push your foot down. 1. Sit on the floor with your L leg extended. 2. Loop a rubber exercise band or tube around the ball of your L foot. The ball of your foot is on the walking surface, right under your toes. The band or tube should be slightly tense when your foot is relaxed. 3. Slowly point your toes downward, pushing them away from you.  4. Hold this position for 5 seconds. 5. Slowly release the tension in the band or tube, controlling smoothly until your foot is back in the starting position. Repeat 10 times. Complete this exercise 2-3 times a day.  Towel curls  1. Sit in a chair on a non-carpeted surface, and put your feet on the floor. 2. Place a towel in front of your feet. 3. Keeping your heel on the floor, put your L foot on the towel. 4. Pull the towel toward you by grabbing the towel with your toes and curling them under. Keep your heel on the floor. 5. Let your toes relax. 6. Grab the towel again. Keep pulling the  towel until it is completely underneath your foot. Repeat 10 times. Complete this exercise 2-3 times a day.  Standing plantar flexion This is an exercise in which you use your toes to lift your body's weight while standing. 1. Stand with your feet shoulder-width apart. 2. Keep your weight spread evenly over the width of your feet while you rise up on your toes. Use a wall or table to steady yourself if needed, but try not to use it for support. 3. If this exercise is too easy, try these options: ? Shift your weight toward your L leg until you feel challenged. ? If told by your health care provider, lift your uninjured leg off the floor. 4. Hold this position for 5 seconds. Repeat 10 times. Complete this exercise 2-3 times a day.  Tandem walking 1. Stand with one foot directly in front of the other. 2. Slowly raise your back foot up, lifting your heel before your toes, and place it directly in front of your other foot. 3. Continue to walk in this heel-to-toe way. Have a countertop or wall nearby to use if needed to keep your balance, but try not to hold onto anything for support.  Repeat 10 times. Complete this exercise 2-3 times a day.   Document Revised: 12/08/2017 Document Reviewed: 12/10/2017 Elsevier Patient Education  2020 [employer].

## 2020-06-22 NOTE — Progress Notes (Signed)
Orthopaedic Clinic Return  Assessment: [name] is a 55 y.o. female with the following: Minimally displaced left distal fibula fracture  Plan: Swelling and pain has significantly improved.  Repeat radiographs today demonstrate a minimally displaced distal left fibula fracture.  She continues to have tenderness to palpation, as well as swelling over the lateral malleolus.  She also has some irritation within the peroneal tendons, with radiating pains proximally.  Based on her current presentation, we can treat this as a distal fibula fracture and transition her to a walking boot.  She can advance her weightbearing as tolerated.  I have also provided her with ankle sprain exercises to improve the strength and mobility within her left ankle.  We will plan to see her back in approximately 4 weeks for repeat evaluation.  If she has any issues as she transitions to weightbearing as tolerated, she can return to clinic earlier than scheduled.   Follow-up: Return in about 4 weeks (around 07/20/2020).   Subjective:  Chief Complaint  Patient presents with  . Foot Pain    Left fibula.     History of Present Illness: [name] is a 55 y.o. female who returns to clinic today for repeat evaluation of her left ankle.  Briefly, we saw her in clinic approximately 2 weeks ago, where she had significant pain, tenderness and swelling diffusely throughout the ankle and the foot.  Her mechanism was not consistent with the amount of swelling witnessed at that time.  In addition, radiographs suggested a minimally displaced distal fibula fracture, but these were not easily visualized.  Since then, her pain has improved.  She continues to ambulate with the assistance of a walker.  She has tolerated the cast well.  Her pain is not as severe.  Review of Systems: No fevers or chills No numbness or tingling No chest pain No shortness of breath No bowel or bladder dysfunction No GI distress No  headaches  Objective: BP (!) 106/55   Pulse 74   Physical Exam:  Alert and oriented.  No acute distress.  She ambulates with the assistance of a walker.  Evaluation of the left ankle and foot demonstrates significantly improved swelling.  She continues to have some residual swelling over the lateral malleolus.  She is also very tender to palpation in this area.  There is some dependent ecchymosis on the lateral aspect of her foot.  She has active dorsiflexion of the ankle and the great toe.  She does have tenderness to palpation along the peroneal musculatures and tendons to the lateral aspect of her foot.  She has difficulty with active eversion of the ankle.  Toes are warm and well perfused.  IMAGING: I personally ordered and reviewed the following images:  X-rays of the left ankle were obtained in clinic today and demonstrates a minimally displaced distal fibula fracture.  The mortise remains intact.  There is been no disruption of the syndesmosis.  No additional fractures or acute injuries are noted.  Impression: Left distal fibula fracture, minimally displaced.  Oliver Barre, MD 06/22/2020 10:58 AM

## 2020-07-21 ENCOUNTER — Telehealth: Payer: Self-pay | Admitting: Orthopedic Surgery

## 2020-07-21 ENCOUNTER — Encounter: Payer: Commercial Managed Care - PPO | Admitting: Orthopedic Surgery

## 2020-07-21 NOTE — Telephone Encounter (Signed)
Patient called at 9:46 left voicemail stating she is having car trouble and will not be able to make it to her appt. She wants [place] to call her back to R/S.   I called patient back and got recording stating we're sorry your call can not be completed at this time, try your call again later

## 2020-07-23 ENCOUNTER — Ambulatory Visit: Payer: Commercial Managed Care - PPO

## 2020-07-23 ENCOUNTER — Encounter: Payer: Self-pay | Admitting: Orthopedic Surgery

## 2020-07-23 ENCOUNTER — Ambulatory Visit (INDEPENDENT_AMBULATORY_CARE_PROVIDER_SITE_OTHER): Payer: Commercial Managed Care - PPO | Admitting: Orthopedic Surgery

## 2020-07-23 ENCOUNTER — Other Ambulatory Visit: Payer: Self-pay

## 2020-07-23 VITALS — Ht 64.0 in | Wt 120.0 lb

## 2020-07-23 DIAGNOSIS — S82832D Other fracture of upper and lower end of left fibula, subsequent encounter for closed fracture with routine healing: Secondary | ICD-10-CM | POA: Diagnosis not present

## 2020-07-23 NOTE — Progress Notes (Signed)
Orthopaedic Clinic Return  Assessment: [name] is a 55 y.o. female with the following: Minimally displaced left distal fibula fracture; left ankle sprain (anterior and medial)   Plan: Pain improving.  She is ambulating well in a walking boot.  Ok to transition to a regular shoe.  Anterior and medial ankle pain will continue to improve.  She likely sustained an ankle sprain, which is consistent with the mechanism of injury.  No additional  Recommendations at this time.  Follow up in 2 months.  She can cancel if doing well at that time.    Follow-up: Return in about 2 months (around 09/22/2020).   Subjective:  Chief Complaint  Patient presents with  . Fracture    LT ankle DOI 05/28/20. Pt states she is noticing some pain on the top of her foot that is getting worse.     History of Present Illness: [name] is a 55 y.o. female who returns to clinic today for repeat evaluation of her left ankle. She injured her ankle 6 weeks ago.  She has been walking with a boot on.  Her pain is better.  She has tried a regular shoe, but this hasn't been tolerated.  She is complaining of pain over the anterior and medial ankle.  This area is very tender.   Review of Systems: No fevers or chills No numbness or tingling No chest pain No shortness of breath No bowel or bladder dysfunction No GI distress No headaches  Objective: Ht 5\' 4"  (1.626 m)   Wt 120 lb (54.4 kg)   BMI 20.60 kg/m   Physical Exam:  Alert and oriented.  No acute distress.  She ambulates without assistive device, in a boot.   Left ankle without swelling.  No redness.  Tenderness over the anterior ankle, as well as medial ankle.  No overlying skin issues.   Active dorsiflexion of the ankle and great toe. Sensation is intact.  Toes are warm and well perfused.  Minimal tenderness over the distal fibula.   IMAGING: I personally ordered and reviewed the following images:  X-rays of the left ankle were  obtained in clinic today and demonstrates a minimally displaced distal fibula fracture.  Interval consolidation. The mortise remains intact.  There is been no disruption of the syndesmosis.  No additional fractures or acute injuries are noted.  Impression: Left distal fibula fracture, minimally displaced.  , MD 07/23/2020 10:16 PM

## 2020-09-21 ENCOUNTER — Ambulatory Visit: Payer: Commercial Managed Care - PPO | Admitting: Orthopedic Surgery

## 2021-01-28 ENCOUNTER — Encounter (HOSPITAL_COMMUNITY): Payer: Self-pay | Admitting: Emergency Medicine

## 2021-01-28 ENCOUNTER — Emergency Department (HOSPITAL_COMMUNITY)
Admission: EM | Admit: 2021-01-28 | Discharge: 2021-01-28 | Disposition: A | Discharge status: Home / Self Care | Payer: Commercial Managed Care - PPO | Attending: Emergency Medicine | Admitting: Emergency Medicine

## 2021-01-28 DIAGNOSIS — J449 Chronic obstructive pulmonary disease, unspecified: Secondary | ICD-10-CM | POA: Diagnosis not present

## 2021-01-28 DIAGNOSIS — I509 Heart failure, unspecified: Secondary | ICD-10-CM | POA: Diagnosis not present

## 2021-01-28 DIAGNOSIS — M25572 Pain in left ankle and joints of left foot: Secondary | ICD-10-CM | POA: Insufficient documentation

## 2021-01-28 DIAGNOSIS — J45909 Unspecified asthma, uncomplicated: Secondary | ICD-10-CM | POA: Diagnosis not present

## 2021-01-28 DIAGNOSIS — Z87891 Personal history of nicotine dependence: Secondary | ICD-10-CM | POA: Insufficient documentation

## 2021-01-28 NOTE — ED Provider Notes (Signed)
[facility] EMERGENCY DEPARTMENT Provider Note   CSN: [id number] Arrival date & time: 01/28/21  3790     History Chief Complaint  Patient presents with   Ankle Pain    [name] is a 55 y.o. female.  HPI Patient presents with left ankle pain.  She has a notable history of fracture earlier in the year, but notes that she been recovering generally well.  However 3 days she has had tightness in the ankle, foot, calf, leg.  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  No new swelling or complete lack of sensation in the foot, no other systemic complaints.  No relief with rest.  Symptoms are worse with activity.  Pain is severe, seemingly radiating up both sides of the lower leg from the ankle.  She has not spoken with her orthopedist to care for her following fracture, nor sought care from another physician yet.    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  Poor   ECTOPIC PREGNANCY SURGERY     ESOPHAGOGASTRODUODENOSCOPY  07/16/2007   [id number] esophagus, small hiatal  hernia, and submucosal petechial  hemorrhages secondary to heaving   HERNIA REPAIR     HIP SURGERY     KNEE SURGERY       OB History   No obstetric history on file.     History reviewed. No pertinent family history.  Social History   Tobacco Use   Smoking status: Former    Packs/day: 1.00    Types: Cigarettes    Quit date: 11/26/2011    Years since quitting: 9.1   Smokeless tobacco: Current    Types: Chew  Substance Use Topics   Alcohol use: No   Drug use: No    Home Medications Prior to Admission medications   Medication Sig Start Date End Date Taking? Authorizing Provider  HYDROcodone-acetaminophen (NORCO/VICODIN) 5-325 MG tablet Take 1 tablet by mouth every 6 (six) hours as needed for moderate pain. Patient not taking: Reported on 07/23/2020 06/04/20   Oliver Barre, MD  naproxen sodium (ALEVE) 220 MG tablet Take 220 mg by mouth 2 (two) times daily as needed (pain).    [provider]    Allergies    Codeine, Penicillins, and Zofran [ondansetron hcl]  Review of Systems   Review of Systems  Constitutional:        Per HPI, otherwise negative  HENT:         Per HPI, otherwise negative  Respiratory:  Per HPI, otherwise negative  Cardiovascular:        Per HPI, otherwise negative  Gastrointestinal:  Negative for vomiting.  Endocrine:       Negative aside from HPI  Genitourinary:        Neg aside from HPI   Musculoskeletal:        Per HPI, otherwise negative  Skin: Negative.   Neurological:  Negative for syncope.   Physical Exam Updated Vital Signs BP 104/83 (BP Location: Left Arm)   Pulse 73   Temp 97.7 F (36.5 C) (Oral)   Resp 18   Ht 5\' 4"  (1.626 m)   Wt 54.4 kg   SpO2 96%   BMI 20.59 kg/m   Physical Exam Vitals and nursing note reviewed.  Constitutional:      General: She is not in acute distress.    Appearance: She is well-developed.  HENT:     Head: Normocephalic and atraumatic.  Eyes:     Conjunctiva/sclera: Conjunctivae normal.  Cardiovascular:      Rate and Rhythm: Normal rate and regular rhythm.     Pulses: Normal pulses.  Pulmonary:     Effort: Pulmonary effort is normal. No respiratory distress.     Breath sounds: No stridor.  Abdominal:     General: There is no distension.  Musculoskeletal:     Comments: No deformity of the ankle, calf, foot, knee.  No edema, no distention, no size discrepancy with contralateral ankle.  Range of motion appropriate, tendons functioning appropriately, sensation intact.  Skin:    General: Skin is warm and dry.     Comments: No abnormalities distal lower extremity left side.  Neurological:     Mental Status: She is alert and oriented to person, place, and time.     Cranial Nerves: No cranial nerve deficit.    ED Results / Procedures / Treatments   Labs (all labs ordered are listed, but only abnormal results are displayed) Labs Reviewed - No data to display  EKG None  Radiology No results found.  Procedures Procedures   Medications Ordered in ED Medications - No data to display  ED Course  I have reviewed the triage vital signs and the nursing notes.  Pertinent labs & imaging results that were available during my care of the patient were reviewed by me and considered in my medical decision making (see chart for details).    MDM Rules/Calculators/A&P well-appearing adult female presents with ankle pain.  Pain may be secondary to inflammatory changes due to prior fracture and resumption of work activities.  No evidence for acute new fracture, DVT.  No cutaneous changes suggesting cellulitis.  She is distally neurovascularly intact.  Patient encouraged to follow-up with our orthopedic colleagues for therapy, ongoing monitoring, management.  Work note provided per request.                         Final Clinical Impression(s) / ED Diagnoses Final diagnoses:  Acute left ankle pain    Rx / DC Orders ED Discharge Orders     None        , MD 01/28/21 [phone]

## 2021-01-28 NOTE — ED Notes (Signed)
Pt requesting work note til Monday and referral to ortho specialist in [place].

## 2021-01-28 NOTE — Discharge Instructions (Signed)
Please be sure to follow-up with our orthopedic colleagues in [place].  Discuss physical therapy options and appropriate ongoing monitoring and management of your ankle pain.  Return here for concerning changes in the interim.  In the short-term, please use ibuprofen, 400 mg, taken 3 times daily with food, rest and ice for ankle pain relief.

## 2021-01-28 NOTE — ED Triage Notes (Signed)
Pt c/o left ankle pain that started Wednesday. Pt states she had fracture to same ankle back in March and was told by ortho that she may start having problems due to the damage to tendons.

## 2021-12-08 IMAGING — CT CT HEAD W/O CM
3 series · 16 of 47 positions shown, 19 images · non-contrast
Comparison: 08/05/2012 head CT

CLINICAL DATA: 54-year-old female with acute LEFT leg numbness.

EXAM:
CT HEAD WITHOUT CONTRAST
TECHNIQUE: Contiguous axial images were obtained from the base of the skull
through the vertex without intravenous contrast.

[Series 2: head w o · axial · 0.43mm/px · z∈[-16,+114]mm · 10 of 32 slices shown, 13 images]
[im 3/32  brain]
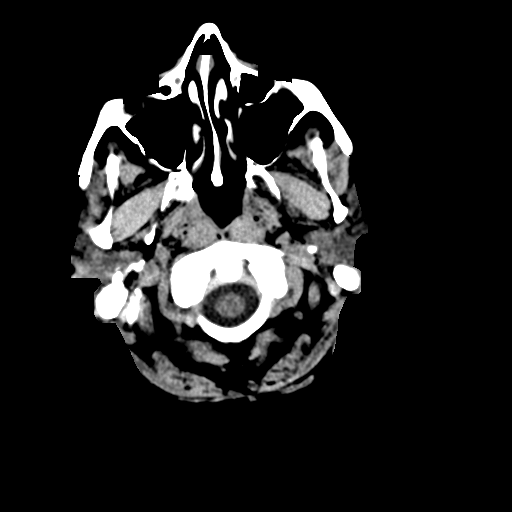
[im 3/32  bone]
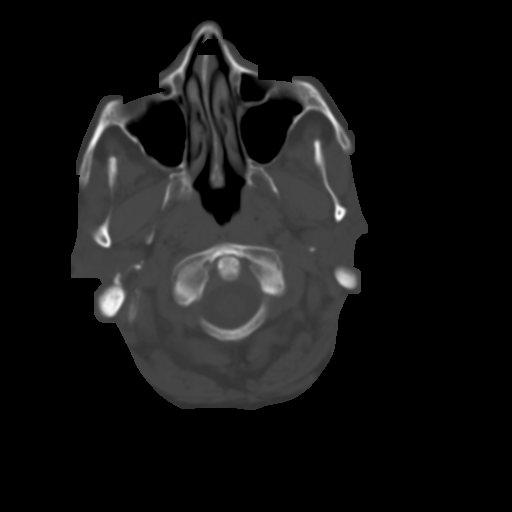
[im 6/32  brain]
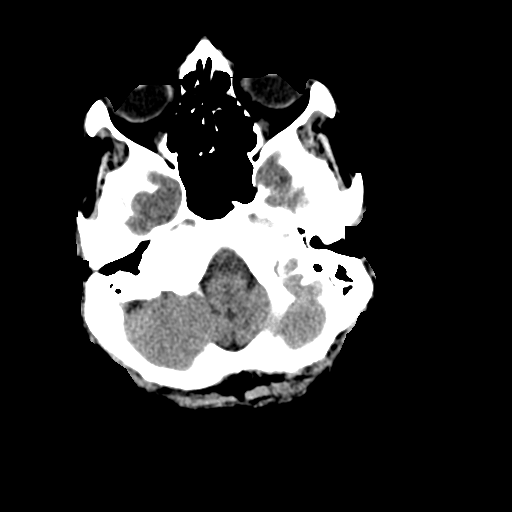
[im 9/32  brain]
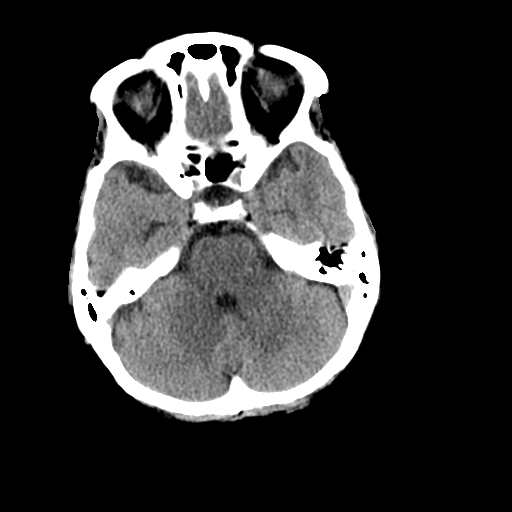
[im 11/32  brain]
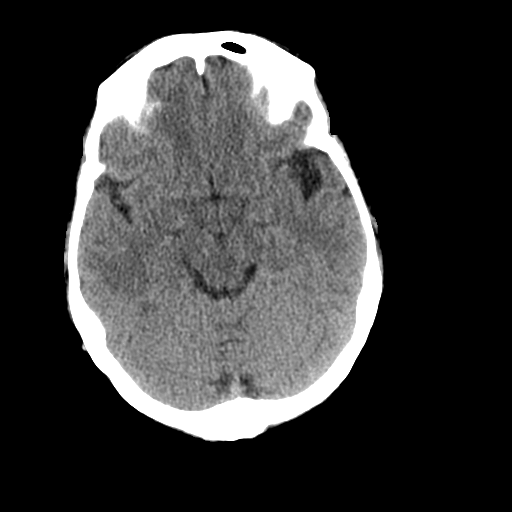
[im 14/32  brain]
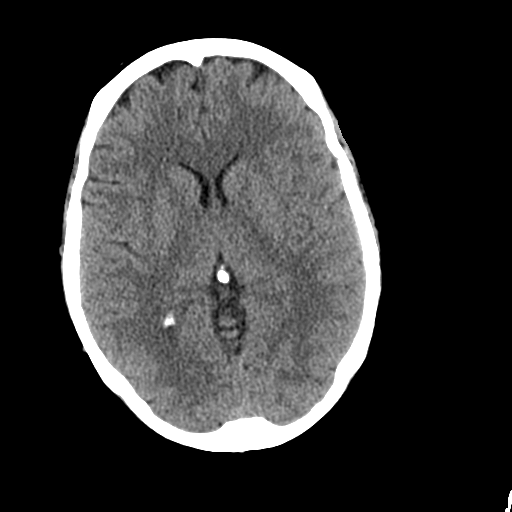
[im 14/32  bone]
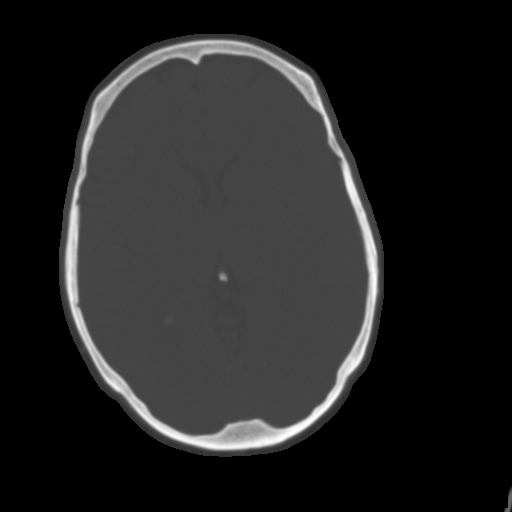
[im 18/32  brain]
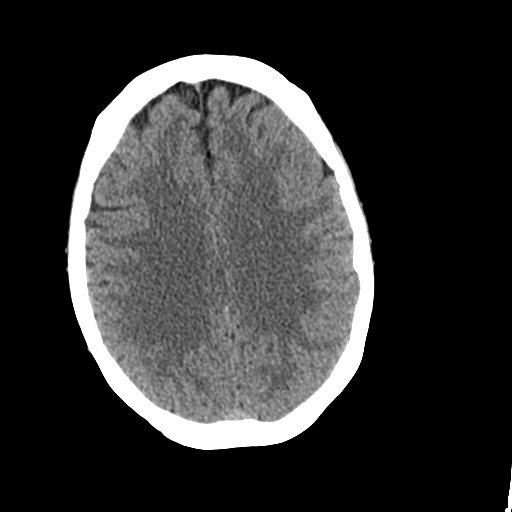
[im 21/32  brain]
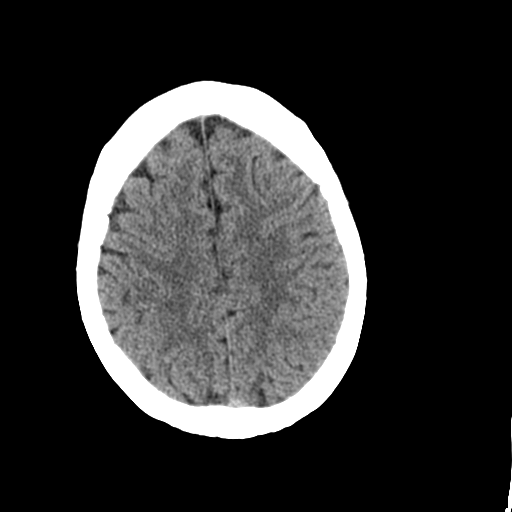
[im 24/32  brain]
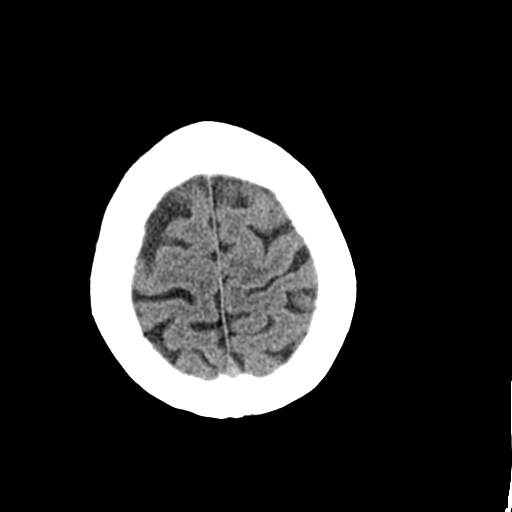
[im 26/32  brain]
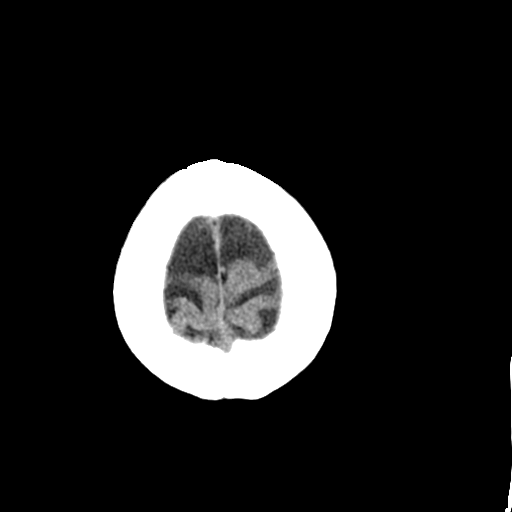
[im 26/32  bone]
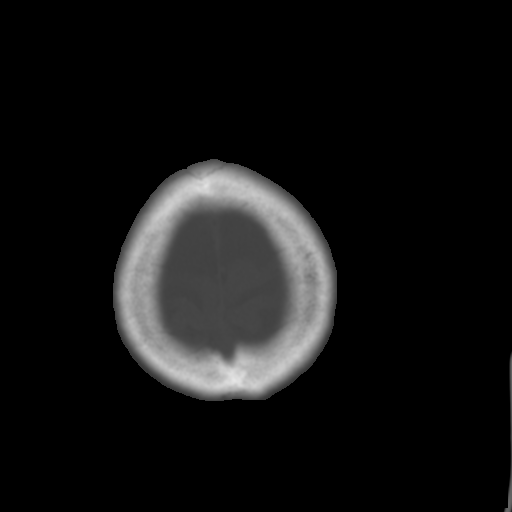
[im 29/32  brain]
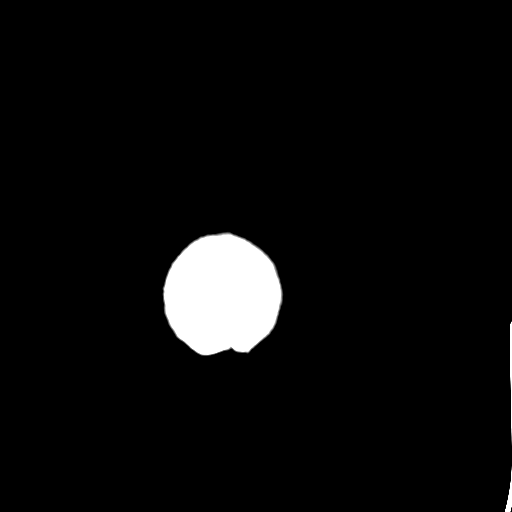

[Series 4: coronal soft · coronal · 0.31mm/px · 3 of 67 slices shown]
[im 23/67  brain]
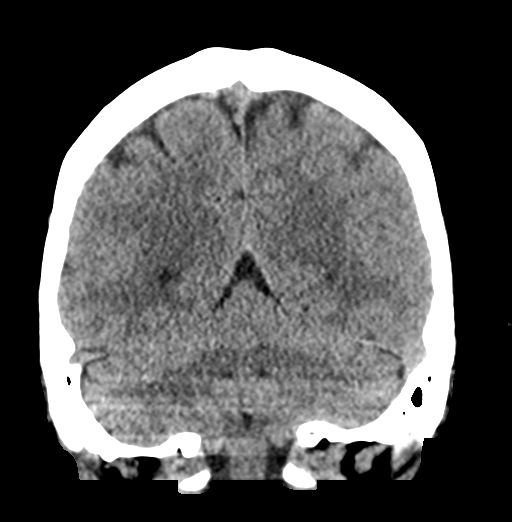
[im 30/67  brain]
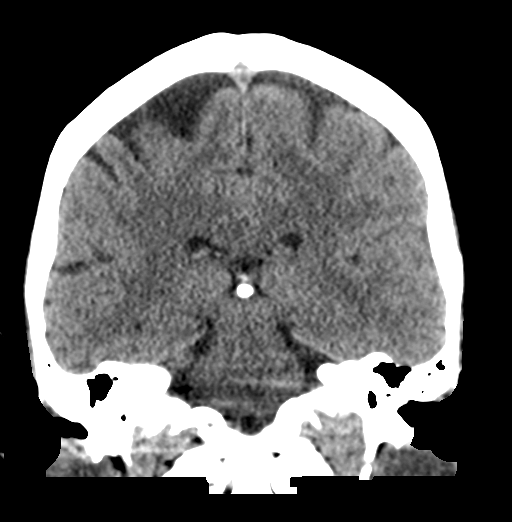
[im 37/67  brain]
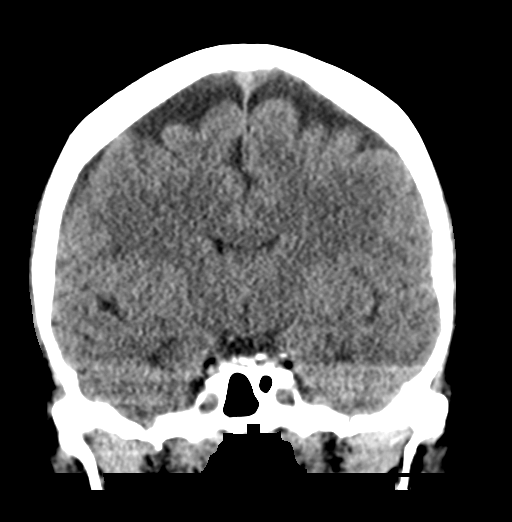

[Series 5: sagittal soft · sagittal · 0.31mm/px · 3 of 57 slices shown]
[im 19/57  brain]
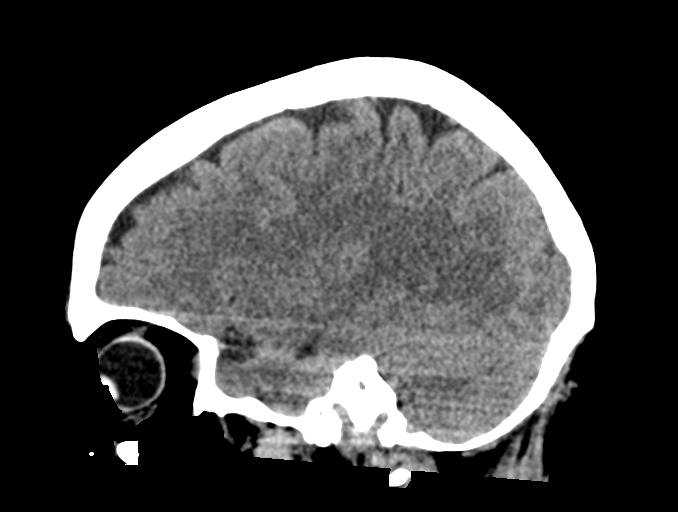
[im 29/57  brain]
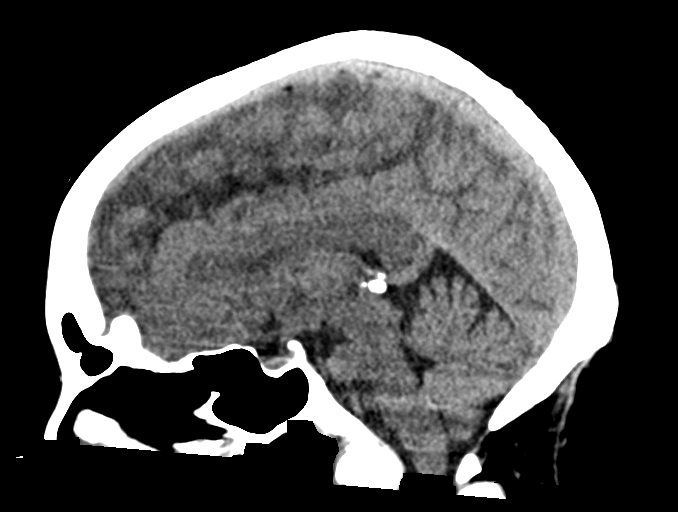
[im 38/57  brain]
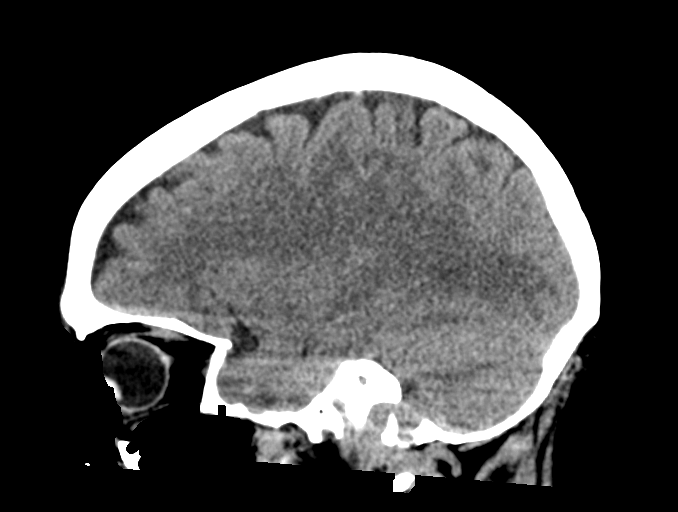

[16 of 47 positions shown; findings below may reference images not displayed]

FINDINGS: Brain: No evidence of acute infarction, hemorrhage, hydrocephalus,
extra-axial collection or mass lesion/mass effect.

Vascular: No hyperdense vessel or unexpected calcification.

Skull: Normal. Negative for fracture or focal lesion.

Sinuses/Orbits: No acute finding.

Other: None.
IMPRESSION: Unremarkable noncontrast head CT.

## 2023-03-12 ENCOUNTER — Encounter: Payer: Self-pay | Admitting: *Deleted

## 2023-03-14 ENCOUNTER — Other Ambulatory Visit (HOSPITAL_COMMUNITY): Payer: Self-pay | Admitting: Nurse Practitioner

## 2023-03-14 DIAGNOSIS — F17200 Nicotine dependence, unspecified, uncomplicated: Secondary | ICD-10-CM

## 2023-03-14 DIAGNOSIS — Z122 Encounter for screening for malignant neoplasm of respiratory organs: Secondary | ICD-10-CM

## 2023-03-14 DIAGNOSIS — Z1231 Encounter for screening mammogram for malignant neoplasm of breast: Secondary | ICD-10-CM

## 2023-03-23 ENCOUNTER — Ambulatory Visit (HOSPITAL_COMMUNITY)
Admission: RE | Admit: 2023-03-23 | Discharge: 2023-03-23 | Disposition: A | Discharge status: Home / Self Care | Payer: Commercial Managed Care - PPO | Source: Ambulatory Visit | Attending: Nurse Practitioner | Admitting: Nurse Practitioner

## 2023-03-23 DIAGNOSIS — F17200 Nicotine dependence, unspecified, uncomplicated: Secondary | ICD-10-CM | POA: Insufficient documentation

## 2023-03-23 DIAGNOSIS — Z122 Encounter for screening for malignant neoplasm of respiratory organs: Secondary | ICD-10-CM | POA: Insufficient documentation

## 2023-04-02 ENCOUNTER — Ambulatory Visit (HOSPITAL_COMMUNITY): Payer: Commercial Managed Care - PPO

## 2023-05-28 ENCOUNTER — Telehealth: Payer: Self-pay | Admitting: *Deleted

## 2023-05-28 NOTE — Telephone Encounter (Signed)
  Procedure: COLONOSCOPY  Height: 5'4 Weight: 123LBS       Have you had a colonoscopy before?  2008  Do you have family history of colon cancer?  NO  Do you have a family history of polyps? YES  Previous colonoscopy with polyps removed?   Do you have a history colorectal cancer?   NO  Are you diabetic?  NO  Do you have a prosthetic or mechanical heart valve? NO  Do you have a pacemaker/defibrillator?   NO  Have you had endocarditis/atrial fibrillation?  NO  Do you use supplemental oxygen/CPAP?  NO  Have you had joint replacement within the last 12 months?  NO  Do you tend to be constipated or have to use laxatives?  NO   Do you have history of alcohol use? If yes, how much and how often.  NO  Do you have history or are you using drugs? If yes, what do are you  using?  NO  Have you ever had a stroke/heart attack?  NO  Have you ever had a heart or other vascular stent placed,?NO  Do you take weight loss medication? NO  female patients,: have you had a hysterectomy? YES                              are you post menopausal?  NO                              do you still have your menstrual cycle? NO    Date of last menstrual period? 2000  Do you take any blood-thinning medications such as: (Plavix, aspirin, Coumadin, Aggrenox, Brilinta, Xarelto, Eliquis, Pradaxa, Savaysa or Effient)? NO  If yes we need the name, milligram, dosage and who is prescribing doctor:               Current Outpatient Medications  Medication Sig Dispense Refill   gabapentin (NEURONTIN) 100 MG capsule Take 100 mg by mouth 2 (two) times daily.     omeprazole (PRILOSEC) 40 MG capsule Take 40 mg by mouth daily.     Vitamin D, Ergocalciferol, (DRISDOL) 1.25 MG (50000 UNIT) CAPS capsule Take 50,000 Units by mouth once a week.     oxyCODONE-acetaminophen (PERCOCET) 10-325 MG tablet Take 1 tablet by mouth 3 (three) times daily as needed.     No current facility-administered medications for this visit.     Allergies  Allergen Reactions   Codeine     REACTION: hives/itch   Penicillins Other (See Comments)    UNKNOWN CHILDHOOD ALLERGY   Zofran [Ondansetron Hcl] Itching and Rash

## 2023-07-25 NOTE — Telephone Encounter (Signed)
 ASA 2 - ok to schedule.

## 2023-07-25 NOTE — Telephone Encounter (Signed)
 Called pt at both #'s listed. No answer and not able to leave VM

## 2023-07-30 NOTE — Telephone Encounter (Signed)
 Called patient and no answer and not able to leave a  VM. Will mail letter.

## 2023-08-31 ENCOUNTER — Emergency Department (HOSPITAL_COMMUNITY)
Admission: EM | Admit: 2023-08-31 | Discharge: 2023-08-31 | Disposition: A | Discharge status: Home / Self Care | Attending: Emergency Medicine | Admitting: Emergency Medicine

## 2023-08-31 ENCOUNTER — Encounter (HOSPITAL_COMMUNITY): Payer: Self-pay

## 2023-08-31 ENCOUNTER — Other Ambulatory Visit: Payer: Self-pay

## 2023-08-31 ENCOUNTER — Emergency Department (HOSPITAL_COMMUNITY)

## 2023-08-31 DIAGNOSIS — M542 Cervicalgia: Secondary | ICD-10-CM | POA: Insufficient documentation

## 2023-08-31 DIAGNOSIS — S39012A Strain of muscle, fascia and tendon of lower back, initial encounter: Secondary | ICD-10-CM | POA: Diagnosis not present

## 2023-08-31 DIAGNOSIS — Y92410 Unspecified street and highway as the place of occurrence of the external cause: Secondary | ICD-10-CM | POA: Diagnosis not present

## 2023-08-31 DIAGNOSIS — M549 Dorsalgia, unspecified: Secondary | ICD-10-CM | POA: Diagnosis present

## 2023-08-31 MED ORDER — IBUPROFEN 600 MG PO TABS: 600.0000 mg | ORAL_TABLET | Freq: Three times a day (TID) | ORAL | 0 refills | Status: DC

## 2023-08-31 MED ORDER — IBUPROFEN 400 MG PO TABS
400.0000 mg | ORAL_TABLET | Freq: Once | ORAL | Status: AC
  Administered 2023-08-31: 400 mg via ORAL
  Filled 2023-08-31: qty 1
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
## 2024-01-15 ENCOUNTER — Emergency Department (HOSPITAL_COMMUNITY)

## 2024-01-15 ENCOUNTER — Other Ambulatory Visit: Payer: Self-pay

## 2024-01-15 ENCOUNTER — Encounter (HOSPITAL_COMMUNITY): Payer: Self-pay

## 2024-01-15 ENCOUNTER — Emergency Department (HOSPITAL_COMMUNITY)
Admission: EM | Admit: 2024-01-15 | Discharge: 2024-01-15 | Disposition: A | Discharge status: Home / Self Care | Attending: Emergency Medicine | Admitting: Emergency Medicine

## 2024-01-15 DIAGNOSIS — G8929 Other chronic pain: Secondary | ICD-10-CM | POA: Diagnosis not present

## 2024-01-15 DIAGNOSIS — M545 Low back pain, unspecified: Secondary | ICD-10-CM | POA: Insufficient documentation

## 2024-01-15 MED ORDER — OXYCODONE-ACETAMINOPHEN 10-325 MG PO TABS
1.0000 | ORAL_TABLET | Freq: Once | ORAL | Status: DC
Start: 2024-01-15 — End: 2024-01-15

## 2024-01-15 MED ORDER — OXYCODONE HCL 5 MG PO TABS
5.0000 mg | ORAL_TABLET | Freq: Once | ORAL | Status: AC
  Administered 2024-01-15: 5 mg via ORAL
  Filled 2024-01-15: qty 1

## 2024-01-15 MED ORDER — OXYCODONE-ACETAMINOPHEN 5-325 MG PO TABS
1.0000 | ORAL_TABLET | Freq: Once | ORAL | Status: AC
  Administered 2024-01-15: 1 via ORAL
  Filled 2024-01-15: qty 1

## 2024-01-15 MED ORDER — KETOROLAC TROMETHAMINE 15 MG/ML IJ SOLN
15.0000 mg | Freq: Once | INTRAMUSCULAR | Status: AC
  Administered 2024-01-15: 15 mg via INTRAMUSCULAR
  Filled 2024-01-15: qty 1

## 2024-01-15 MED ORDER — LIDOCAINE 5 % EX PTCH
1.0000 | MEDICATED_PATCH | CUTANEOUS | Status: DC
  Administered 2024-01-15: 1 via TRANSDERMAL
  Filled 2024-01-15: qty 1

## 2024-01-15 NOTE — ED Notes (Signed)
 See triage notes. States she has taken tylenol  and her oxy 10- with no relief. Hob elevated for comfort.

## 2024-01-15 NOTE — ED Provider Notes (Cosign Needed Addendum)
 [place] EMERGENCY DEPARTMENT AT [facility] Provider Note   CSN: [id number] Arrival date & time: 01/15/24  1647     Patient presents with: Back Pain  HPI [name] is a 58 y.o. female presenting for back pain.  She states it started in February.  Pain is primarily in the left lower paraspinal region but does extend to the midline and down her left leg at times.  Denies urinary or GI symptoms.  Denies saddle anesthesia, IV drug use, recent trauma or fever.  She has been taking Perc-10's and ibuprofen  with minimal relief.  Still able to ambulate and bear weight as long as she is using a cane.  She states she has been using a cane since February.  Denies abdominal pain.    Back Pain      Prior to Admission medications   Medication Sig Start Date End Date Taking? Authorizing Provider  gabapentin (NEURONTIN) 100 MG capsule Take 100 mg by mouth 2 (two) times daily. 05/07/23   [provider]  ibuprofen  (ADVIL ) 600 MG tablet Take 1 tablet (600 mg total) by mouth 3 (three) times daily. 08/31/23   Idol, Julie, PA-C  omeprazole (PRILOSEC) 40 MG capsule Take 40 mg by mouth daily. 04/05/23   [provider]  oxyCODONE -acetaminophen  (PERCOCET) 10-325 MG tablet Take 1 tablet by mouth 3 (three) times daily as needed.    [provider]  Vitamin D, Ergocalciferol, (DRISDOL) 1.25 MG (50000 UNIT) CAPS capsule Take 50,000 Units by mouth once a week. 04/02/23   [provider]    Allergies: Codeine, Penicillins, and Zofran  [ondansetron  hcl]    Review of Systems  Musculoskeletal:  Positive for back pain.    Physical Exam   Vitals:   01/15/24 1705 01/15/24 1708  BP:  (!) 141/94  Pulse:  64  Resp:  17  Temp: 97.7 F (36.5 C)   SpO2:  99%    CONSTITUTIONAL:  well-appearing, NAD NEURO:  Alert and oriented x 3, CN 3-12 grossly intact EYES:  eyes equal and reactive ENT/NECK:  Supple, no stridor  CARDIO:  appears well-perfused  PULM:  No  respiratory distress GI/GU:  non-distended, soft, non tender MSK/SPINE:  No gross deformities, no edema, moves all extremities. Tenderness overlying the left sciatic notch.  Ambulatory with steady gait using a cane.  Active range of motion of the lower back and left hip limited due to pain.  No spinous tenderness.   SKIN:  no rash, atraumatic  *Additional and/or pertinent findings included in MDM below   (all labs ordered are listed, but only abnormal results are displayed) Labs Reviewed - No data to display  EKG: None  Radiology: DG Lumbar Spine 2-3 Views Result Date: 01/15/2024 CLINICAL DATA:  Low back pain EXAM: LUMBAR SPINE - 2-3 VIEW COMPARISON:  08/31/2023 FINDINGS: Lumbar alignment within normal limits. Vertebral body heights are maintained. Six lumbar type vertebra, for the purposes of reporting, transitional vertebra will be lumbarized S1. Similar advanced disc space narrowing L5-S1. Mild facet degenerative changes IMPRESSION: Transitional anatomy. Similar advanced degenerative changes at L5-S1. Electronically Signed   By: Luke Bun M.D.   On: 01/15/2024 18:35   DG Hip Unilat With Pelvis 2-3 Views Left Result Date: 01/15/2024 EXAM: 2 OR MORE VIEW(S) XRAY OF THE LEFT AND RIGHT HIP 01/15/2024 06:15:54 PM COMPARISON: None available. CLINICAL HISTORY: pain. left lower back pain that radiates down her leg. Pt reports pain began in February this year while getting out of bed.  Pt reports pain has only worsened since. FINDINGS: BONES AND JOINTS: No acute fracture or focal osseous lesion. Degenerative changes of left hip with superolateral joint space narrowing and prominent osteophyte formation. Mild degenerative changes of right hip. SOFT TISSUES: The soft tissues are unremarkable. IMPRESSION: 1. Advanced degenerative changes of left hip . 2. No acute fracture. Electronically signed by: Norman Gatlin MD 01/15/2024 06:32 PM EDT RP Workstation: [id number]     Procedures   Medications  Ordered in the ED  lidocaine (LIDODERM) 5 % 1 patch (1 patch Transdermal Patch Applied 01/15/24 1740)  ketorolac (TORADOL) 15 MG/ML injection 15 mg (15 mg Intramuscular Given 01/15/24 1738)  oxyCODONE -acetaminophen  (PERCOCET/ROXICET) 5-325 MG per tablet 1 tablet (1 tablet Oral Given 01/15/24 1748)    And  oxyCODONE  (Oxy IR/ROXICODONE ) immediate release tablet 5 mg (5 mg Oral Given 01/15/24 1748)                                    Medical Decision Making Amount and/or Complexity of Data Reviewed Radiology: ordered.  Risk Prescription drug management.   58 year old well-appearing female presenting for back pain.  Exam notable for left-sided tenderness overlying the sciatic notch and limited active range of motion of the lower back and left hip due to pain.  Without urinary symptoms and CVA tenderness I have low suspicion for UTI or renal pathology.  X-rays of the lumbar spine and left hip are nonacute but do show advanced degenerative changes.  Consider more sinister pathology for back pain but unlikely given lack of red flag symptoms.  Advised continued use of NSAIDs and Percocet for pain at home.  Advised to follow-up with PCP for discussion regarding the persistence of what is likely chronic back pain.  We discussed return precautions.  Discharged.  Also did provide follow-up information for neurosurgery, orthopedics and primary care.     Final diagnoses:  Chronic low back pain, unspecified back pain laterality, unspecified whether sciatica present    ED Discharge Orders     None          Lang Norleen POUR, PA-C 01/15/24 1858    Towana Ozell BROCKS, MD 01/16/24 1034

## 2024-01-15 NOTE — ED Triage Notes (Signed)
 Pt arrived via POV c/o left lower back pain that radiates down her leg. Pt reports pain began in February this year while getting out of bed. Pt reports pain has only worsened since.

## 2024-01-15 NOTE — Discharge Instructions (Addendum)
 Evaluation today was overall reassuring.  Suspect your back and hip pain are likely chronic.  Your x-rays did not show acute findings but there is evidence of advanced degenerative changes.  This could be contributing to your symptoms.  Recommend that you follow-up with your PCP to discuss management of your ongoing chronic back pain.  In the meantime recommend continued use of ibuprofen  and Percocet as you have been prescribed it.  If you develop saddle numbness, urinary or bowel incontinence or retention, fever or new trauma to your back please return to the ED for further evaluation.

## 2024-02-15 ENCOUNTER — Encounter: Payer: Self-pay | Admitting: Physician Assistant

## 2024-02-15 ENCOUNTER — Ambulatory Visit: Admitting: Physician Assistant

## 2024-02-15 DIAGNOSIS — M1990 Unspecified osteoarthritis, unspecified site: Secondary | ICD-10-CM | POA: Diagnosis not present

## 2024-02-15 DIAGNOSIS — R531 Weakness: Secondary | ICD-10-CM

## 2024-02-15 DIAGNOSIS — M25552 Pain in left hip: Secondary | ICD-10-CM

## 2024-02-15 NOTE — Addendum Note (Signed)
 Addended by: EILLEEN ROSINA HERO on: 02/15/2024 11:48 AM   Modules accepted: Orders

## 2024-02-15 NOTE — Progress Notes (Addendum)
 "  Office Visit Note   Patient: [name]           Date of Birth: [birth year]           MRN: [id number] Visit Date: 02/15/2024              Requested by: Reggie Jerrold Costa, NP [address] [place],  [place] [address] PCP: Reggie Jerrold, Costa, NP   Assessment & Plan: Visit Diagnoses:  1. Pain in left hip     Plan: Patient is a pleasant 58 year old woman presents today with a chief complaint of abruptly increasing pain in her left hip and some low back pain with burning radiating down her leg.  She denies any injury.  Recently.  She is concerned that she has a family history of bone cancers and that her left hip seem to progress quite quickly.  Also has a remote history of an infection in her left hip many many years ago which was treated with antibiotics.  On exam today I think she has elements of both lumbar radiculopathy and hip arthritis versus AVN.  The hip is certainly the more problematic to her.  I would like to get an MRI of her left hip.  And if this is the case we could try steroid injection with Dr. Burnetta.  This would be more defined which was more painful to her.  I also will get a be met today as she has not had blood work in a very long time and I would like to prescribe an anti-inflammatory but I do not have any records to her recent be met and she is not sure when she had 1.  She is going to look into getting a different provider at [facility] for primary care as she feels that several issues she has had have not been addressed.  If her being that is satisfactory we will call her in some 7.5 block can  Follow-Up Instructions: Return if symptoms worsen or fail to improve.   Orders:  Orders Placed This Encounter  Procedures   MR Hip Left w/o contrast   No orders of the defined types were placed in this encounter.     Procedures: No procedures performed   Clinical Data: No additional findings.   Subjective: Chief Complaint  Patient presents with    Left Leg - Pain   Left Hip - Pain    HPI pleasant 58 year old woman comes in with a chief complaint of left leg and left hip pain.  She has been going on for a year and a half but she is quite concerned because the last few months especially her left groin pain has escalated significantly causing her difficulties ambulating without the use of a cane.  She is currently seeing chronic pain management and getting oxycodone  10 mg 3 times daily which she said frankly is not working anymore.  Denies any fever or chills but does have a remote history of infection from what she describes in the left hip.  She is a smoker.  Also family history of bone cancer  Review of Systems  All other systems reviewed and are negative.    Objective: Vital Signs: There were no vitals taken for this visit.  Physical Exam Constitutional:      Appearance: Normal appearance.  Pulmonary:     Effort: Pulmonary effort is normal.  Skin:    General: Skin is warm and dry.  Neurological:     General: No focal deficit  present.     Mental Status: She is alert and oriented to person, place, and time.  Psychiatric:        Mood and Affect: Mood normal.        Behavior: Behavior normal.     Ortho Exam Examination of her low back she does have some tenderness to palpation over the lower spine.  She has some radicular findings down into the left leg but has a negative state straight leg raise and strength overall is 4 out of 5.  She has extremely tender to palpation in the left groin does not allow me to do much internal/external rotation because it is so painful.  No redness no erythema compartments are soft and nontender Specialty Comments:  No specialty comments available.  Imaging: No results found.   PMFS History: Patient Active Problem List   Diagnosis Date Noted   Hypocalcemia 08/06/2012   General weakness 08/05/2012   Hypokalemia 08/05/2012   CHF (congestive heart failure) (HCC)    Arthritis    COPD  (chronic obstructive pulmonary disease) (HCC)    ANXIETY DEPRESSION 01/06/2009   GERD 01/06/2009   ANOREXIA 01/06/2009   NAUSEA 01/06/2009   DIARRHEA, CHRONIC 01/06/2009   ABDOMINAL PAIN 01/06/2009   ANEMIA, HX OF 01/06/2009   DRUG ABUSE, HX OF 01/06/2009   Chronic airway obstruction, not elsewhere classified 08/16/2007   Past Medical History:  Diagnosis Date   Anemia    Arthritis    Asthma    CHF (congestive heart failure) (HCC)    COPD (chronic obstructive pulmonary disease) (HCC)     No family history on file.  Past Surgical History:  Procedure Laterality Date   ABDOMINAL HYSTERECTOMY     CHOLECYSTECTOMY     COLONOSCOPY  07/16/2007   RMR:  Normal rectum, colon, and terminal ileum.  Poor   ECTOPIC PREGNANCY SURGERY     ESOPHAGOGASTRODUODENOSCOPY  07/16/2007   MFM:Wnmfjo esophagus, small hiatal  hernia, and submucosal petechial hemorrhages secondary to heaving   HERNIA REPAIR     HIP SURGERY     KNEE SURGERY     Social History   Occupational History   Not on file  Tobacco Use   Smoking status: Former    Current packs/day: 0.00    Types: Cigarettes    Quit date: 11/26/2011    Years since quitting: 12.2    Passive exposure: Past   Smokeless tobacco: Current    Types: Chew  Vaping Use   Vaping status: Never Used  Substance and Sexual Activity   Alcohol use: No   Drug use: No   Sexual activity: Yes    Birth control/protection: Surgical        "

## 2024-02-16 LAB — BASIC METABOLIC PANEL WITH GFR
BUN: 25 mg/dL (ref 7–25)
CO2: 22 mmol/L (ref 20–32)
Calcium: 9.6 mg/dL (ref 8.6–10.4)
Chloride: 106 mmol/L (ref 98–110)
Creat: 0.99 mg/dL (ref 0.50–1.03)
Glucose, Bld: 89 mg/dL (ref 65–99)
Potassium: 4.8 mmol/L (ref 3.5–5.3)
Sodium: 138 mmol/L (ref 135–146)
eGFR: 66 mL/min/1.73m2 (ref >=60)

## 2024-02-16 LAB — EXTRA LAV TOP TUBE

## 2024-02-26 ENCOUNTER — Other Ambulatory Visit: Payer: Self-pay | Admitting: Physician Assistant

## 2024-02-26 ENCOUNTER — Telehealth: Payer: Self-pay | Admitting: Physician Assistant

## 2024-02-26 MED ORDER — MELOXICAM 15 MG PO TABS: 15.0000 mg | ORAL_TABLET | Freq: Every day | ORAL | 0 refills | Status: DC

## 2024-02-26 NOTE — Telephone Encounter (Signed)
 Patient called and said that Ronal dragon told her she would call for the blood work results and also that she was suppose to call in some inflammatory for her. CB#[phone]

## 2024-02-28 ENCOUNTER — Encounter: Payer: Self-pay | Admitting: Physician Assistant

## 2024-03-04 ENCOUNTER — Other Ambulatory Visit

## 2024-03-14 ENCOUNTER — Inpatient Hospital Stay: Admission: RE | Admit: 2024-03-14 | Discharge: 2024-03-14 | Attending: Physician Assistant

## 2024-03-14 DIAGNOSIS — M25552 Pain in left hip: Secondary | ICD-10-CM

## 2024-04-01 DIAGNOSIS — M1612 Unilateral primary osteoarthritis, left hip: Secondary | ICD-10-CM

## 2024-04-01 MED ORDER — MELOXICAM 15 MG PO TABS: 15.0000 mg | ORAL_TABLET | Freq: Every day | ORAL | 0 refills | Status: AC

## 2024-04-08 ENCOUNTER — Ambulatory Visit: Admitting: Physician Assistant

## 2024-05-05 ENCOUNTER — Ambulatory Visit: Admitting: Orthopaedic Surgery
